# Patient Record
Sex: Female | Born: 1995 | Race: White | Hispanic: Yes | Marital: Single | Smoking: Never smoker
Health system: Southern US, Community
[De-identification: ages and names within clinical notes are randomized; demographics above are authoritative.]

## PROBLEM LIST (undated history)

## (undated) DIAGNOSIS — J45909 Unspecified asthma, uncomplicated: Secondary | ICD-10-CM

## (undated) DIAGNOSIS — L709 Acne, unspecified: Secondary | ICD-10-CM

## (undated) HISTORY — DX: Acne, unspecified: L70.9

## (undated) HISTORY — DX: Unspecified asthma, uncomplicated: J45.909

---
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
## 2017-05-01 ENCOUNTER — Encounter: Payer: Self-pay | Admitting: Family Medicine

## 2017-05-01 ENCOUNTER — Ambulatory Visit: Payer: BC Managed Care – PPO | Admitting: Family Medicine

## 2017-05-01 VITALS — BP 110/70 | Temp 98.8°F | Ht 62.0 in | Wt 150.0 lb

## 2017-05-01 DIAGNOSIS — R21 Rash and other nonspecific skin eruption: Secondary | ICD-10-CM | POA: Diagnosis not present

## 2017-05-01 MED ORDER — CLINDAMYCIN PHOSPHATE 1 % EX GEL: Freq: Two times a day (BID) | CUTANEOUS | 3 refills | Status: DC

## 2017-05-01 MED ORDER — MOMETASONE FUROATE 0.1 % EX CREA: TOPICAL_CREAM | CUTANEOUS | 0 refills | Status: DC

## 2017-05-01 MED ORDER — ALBUTEROL SULFATE HFA 108 (90 BASE) MCG/ACT IN AERS: 2.0000 | INHALATION_SPRAY | Freq: Four times a day (QID) | RESPIRATORY_TRACT | 2 refills | Status: DC | PRN

## 2017-05-01 NOTE — Progress Notes (Signed)
   Subjective:    Patient ID: Kaitlyn PontesChristina Munoz, female    DOB: 12/21/1995, 22 y.o.   MRN: [id number]  Rash  This is a new problem. Episode onset: 2 weeks. Treatments tried: tried in the past clindamycin ph and benzolyl perox and tretinoin cream.  has appt with derm on feb 28th.   Requesting refill on ventolin inhaler, uses prn only, not very often   At school [employer] majoring in [employer]   was using the tretinoin and clindamycin , with the  Acne flared up scaly and itchy near the areas of acne  Skin dryi and scaly   Also aroound the ch  Still persisted znd itchy after using both meds  Pt feels tretinoin .05 %  Caused it   Used als o clind benzoyl pe 1.2 ? 5                                Review of Systems  Skin: Positive for rash.       Objective:   Physical Exam Alert vitals stable, NAD. Blood pressure good on repeat. HEENT normal. Lungs clear. Heart regular rate and rhythm. Cheeks scattered inflamed comedones lateral forehead lateral cheeks perioral positive scaly eczematous-like eruption       Assessment & Plan:  Impression acne with eczema plan stop tretinoin.  Stop benzoyl peroxide.  Cleocin gel twice daily.  Add Elocon cream twice daily to eczema-like rash

## 2017-07-23 ENCOUNTER — Telehealth: Payer: Self-pay | Admitting: Family Medicine

## 2017-07-23 NOTE — Telephone Encounter (Signed)
Please let the patient know that I recently received a note from [facility] stating that the patient had elevated liver enzymes.-According to the note the patient is aware of it.  It is important for the patient to do a follow-up office visit to discuss this plus also do necessary testing to help try to figure out the cause of this.  It is possible we may have to refer the patient to gastroenterology after initial work-up if it does not reveal the reason.  Liver enzyme AST 32 ALT 72 normal bilirubin on July 17, 2017 with lab work from dermatology it is important for the patient's health that she understands she needs to schedule this office visit and do a follow-up regarding this.  Please document accordingly.

## 2017-07-24 NOTE — Telephone Encounter (Signed)
Patient contacted; pt scheduling a follow up visit

## 2017-08-02 ENCOUNTER — Ambulatory Visit: Payer: BC Managed Care – PPO | Admitting: Family Medicine

## 2017-08-02 VITALS — BP 118/80 | Ht 62.0 in | Wt 150.6 lb

## 2017-08-02 DIAGNOSIS — R740 Nonspecific elevation of levels of transaminase and lactic acid dehydrogenase [LDH]: Secondary | ICD-10-CM | POA: Diagnosis not present

## 2017-08-02 DIAGNOSIS — R7401 Elevation of levels of liver transaminase levels: Secondary | ICD-10-CM

## 2017-08-02 NOTE — Progress Notes (Signed)
   Subjective:    Patient ID: Kaitlyn Munoz, female    DOB: [birth year], 22 y.o.   MRN: [id number]  HPI Pt following up from dermatologist. Dr. Michell Heinrich at [facility]. Patient with elevated liver enzymes Please see telephone message recently that has a details She has a family history of liver cancer with her grandfather fatty liver with her mother patient denies drinking alcohol on a regular basis denies hepatitis denies health or sickness issues Recently started Accutane but states liver enzymes slightly elevated before that  Review of Systems  Constitutional: Negative for activity change, fatigue and fever.  HENT: Negative for congestion.   Respiratory: Negative for cough, chest tightness and shortness of breath.   Cardiovascular: Negative for chest pain and leg swelling.  Gastrointestinal: Negative for abdominal pain.  Skin: Negative for color change.  Neurological: Negative for headaches.  Psychiatric/Behavioral: Negative for behavioral problems.       Objective:   Physical Exam  Constitutional: She appears well-nourished. No distress.  Cardiovascular: Normal rate, regular rhythm and normal heart sounds.  No murmur heard. Pulmonary/Chest: Effort normal and breath sounds normal. No respiratory distress.  Musculoskeletal: She exhibits no edema.  Lymphadenopathy:    She has no cervical adenopathy.  Neurological: She is alert. She exhibits normal muscle tone.  Psychiatric: Her behavior is normal.  Vitals reviewed.  Abdomen soft no enlargement of the liver       Assessment & Plan:  Elevated liver enzyme Check lab work If still elevated will need further testing including ultrasound and extensive panel extensive panel if ongoing troubles will need GI referral  Patient understands

## 2017-08-03 LAB — HEPATIC FUNCTION PANEL
ALK PHOS: 84 IU/L (ref 39–117)
ALT: 140 IU/L — AB (ref 0–32)
AST: 69 IU/L — AB (ref 0–40)
Albumin: 4.3 g/dL (ref 3.5–5.5)
BILIRUBIN TOTAL: 0.2 mg/dL (ref 0.0–1.2)
Bilirubin, Direct: 0.09 mg/dL (ref 0.00–0.40)
Total Protein: 7 g/dL (ref 6.0–8.5)

## 2017-08-04 ENCOUNTER — Other Ambulatory Visit: Payer: Self-pay | Admitting: *Deleted

## 2017-08-04 DIAGNOSIS — R748 Abnormal levels of other serum enzymes: Secondary | ICD-10-CM

## 2017-08-07 ENCOUNTER — Telehealth: Payer: Self-pay

## 2017-08-07 NOTE — Telephone Encounter (Signed)
Patient is scheduled for U/s abd and needs to be e signed please  

## 2017-08-10 ENCOUNTER — Encounter: Payer: Self-pay | Admitting: Gastroenterology

## 2017-08-10 ENCOUNTER — Encounter (INDEPENDENT_AMBULATORY_CARE_PROVIDER_SITE_OTHER): Payer: Self-pay

## 2017-08-10 ENCOUNTER — Encounter: Payer: Self-pay | Admitting: Family Medicine

## 2017-08-11 ENCOUNTER — Ambulatory Visit (HOSPITAL_COMMUNITY)
Admission: RE | Admit: 2017-08-11 | Discharge: 2017-08-11 | Disposition: A | Discharge status: Home / Self Care | Payer: BC Managed Care – PPO | Source: Ambulatory Visit | Attending: Family Medicine | Admitting: Family Medicine

## 2017-08-11 DIAGNOSIS — R748 Abnormal levels of other serum enzymes: Secondary | ICD-10-CM | POA: Diagnosis not present

## 2017-08-12 LAB — FERRITIN: FERRITIN: 84 ng/mL (ref 15–150)

## 2017-08-12 LAB — HEPATITIS C ANTIBODY: Hep C Virus Ab: <0.1 s/co ratio (ref 0.0–0.9)

## 2017-08-12 LAB — ANA: ANA: POSITIVE — AB

## 2017-08-12 LAB — HEPATITIS B SURFACE ANTIGEN: Hepatitis B Surface Ag: NEGATIVE

## 2017-08-12 LAB — CERULOPLASMIN: CERULOPLASMIN: 46.3 mg/dL — AB (ref 19.0–39.0)

## 2017-10-06 ENCOUNTER — Other Ambulatory Visit (INDEPENDENT_AMBULATORY_CARE_PROVIDER_SITE_OTHER): Payer: BC Managed Care – PPO

## 2017-10-06 ENCOUNTER — Encounter: Payer: Self-pay | Admitting: Gastroenterology

## 2017-10-06 ENCOUNTER — Ambulatory Visit: Payer: BC Managed Care – PPO | Admitting: Gastroenterology

## 2017-10-06 VITALS — BP 94/72 | HR 72 | Ht 62.0 in | Wt 147.0 lb

## 2017-10-06 DIAGNOSIS — K602 Anal fissure, unspecified: Secondary | ICD-10-CM

## 2017-10-06 DIAGNOSIS — R7989 Other specified abnormal findings of blood chemistry: Secondary | ICD-10-CM

## 2017-10-06 DIAGNOSIS — R945 Abnormal results of liver function studies: Secondary | ICD-10-CM | POA: Diagnosis not present

## 2017-10-06 LAB — HEPATIC FUNCTION PANEL
ALBUMIN: 4.4 g/dL (ref 3.5–5.2)
ALK PHOS: 87 U/L (ref 39–117)
ALT: 39 U/L — ABNORMAL HIGH (ref 0–35)
AST: 23 U/L (ref 0–37)
Bilirubin, Direct: 0.1 mg/dL (ref 0.0–0.3)
Total Bilirubin: 0.4 mg/dL (ref 0.2–1.2)
Total Protein: 7.9 g/dL (ref 6.0–8.3)

## 2017-10-06 MED ORDER — POLYETHYLENE GLYCOL 3350 17 G PO PACK: 17.0000 g | PACK | Freq: Every day | ORAL | 0 refills | Status: DC

## 2017-10-06 MED ORDER — AMBULATORY NON FORMULARY MEDICATION: 0 refills | Status: DC

## 2017-10-06 NOTE — Progress Notes (Signed)
HPI :  22 year old female with history of acne and asthma, referred to our office by Babs Sciara, MD, for elevated liver enzymes.  The patient was noted to have the following labs over the past few months as outlined below from review of her chart:  06/16/17 - ALT 63, AST 23 07/17/17 - ALT 72, AST 32 08/02/17 - ALT 140, AST 69, AP 84, T bil 0.2, T prot 7.0 ANA (+) Ceruloplasmin, ferritin, hep B surface antigen, hep C AB negative  [place] 08/11/17 - normal  She denies any known history of liver problems that she is aware of. She never been jaundiced. No abdominal pains, no problems with edema. Her mother has a history of lymphoma. No known family history of chronic liver disease. She has been taking Accutane for the treatment of acne since April 1. Her initial ALT level that was abnormal was at the end of March. Most recently in May ALT peaked at 140. She continues to take Accutane and is planned to take this for another 2 months or so. She denies any other new medications that she has been taking. She denies any herbal supplements, she denies any over the counter products otherwise. She had an ultrasound of the liver on May 24 which was normal. She denies any history of autoimmune diseases. She has a positive ANA on lab work. She denies tobacco use. She drinks rare alcohol socially  Otherwise has noted a few episodes of blood on the toilet paper with wiping. This is been associated with pain in her rectum with bowel movements. She does endorse some hard stools and straining more recently since she has been on the Accutane. She thinks is related to her symptoms. No abdominal pains   Past Medical History:  Diagnosis Date  . Acne   . Asthma      No past surgical history on file. No family history on file. Social History   Tobacco Use  . Smoking status: Never Smoker  Substance Use Topics  . Alcohol use: Not on file  . Drug use: Not on file   Current Outpatient Medications  Medication  Sig Dispense Refill  . albuterol (PROVENTIL HFA;VENTOLIN HFA) 108 (90 Base) MCG/ACT inhaler Inhale 2 puffs into the lungs every 6 (six) hours as needed for wheezing or shortness of breath.    . drospirenone-ethinyl estradiol (YASMIN,ZARAH,SYEDA) 3-0.03 MG tablet Take 1 tablet by mouth daily.    . ISOtretinoin (ACCUTANE) 40 MG capsule Take 40 mg by mouth 2 (two) times daily.    . AMBULATORY NON FORMULARY MEDICATION Medication Name: Nitroglycerine ointment 0.125 %  Apply a pea sized amount internally three times daily. Dispense 30 GM zero refill 30 g 0  . polyethylene glycol (MIRALAX) packet Take 17 g by mouth daily. 14 each 0   No current facility-administered medications for this visit.    No Known Allergies   Review of Systems: All systems reviewed and negative except where noted in HPI.    No results found.  Physical Exam: BP 94/72   Pulse 72   Ht 5\' 2"  (1.575 m)   Wt 147 lb (66.7 kg)   BMI 26.89 kg/m  Constitutional: Pleasant,well-developed, female in no acute distress. HEENT: Normocephalic and atraumatic. Conjunctivae are normal. No scleral icterus. Neck supple.  Cardiovascular: Normal rate, regular rhythm.  Pulmonary/chest: Effort normal and breath sounds normal. No wheezing, rales or rhonchi. Abdominal: Soft, nondistended, nontender. There are no masses palpable. No hepatomegaly. DRE - standby Lucius Conn CMA,  small posterior midline anal fissure, no mass lesions otherwise Extremities: no edema Lymphadenopathy: No cervical adenopathy noted. Neurological: Alert and oriented to person place and time. Skin: Skin is warm and dry. No rashes noted. Psychiatric: Normal mood and affect. Behavior is normal.   ASSESSMENT AND PLAN: 22 year old female with a history as outlined above, here for new patient assessment of the following issues:  Abnormal liver function testing - elevated ALT and AST. Noted to be present prior to initiating Accutane, which has since risen since  starting Accutane. Workup thus far has showed a positive ANA, while imaging negative. I discussed ddx with the patient and her mother who was present today. Liver test abnormalities occur in up to 15% of patients on Accutane, although marked elevations above three times the upper limit of normal or requiring drug discontinuation are rare (<1%). Will send additional labs today, we'll repeat LFTs to see where this is trending and get a copy of the labs from July 1. I will check an IgG level and smooth muscle antibody as well as antimitochondrial antibody to screen for autoimmune liver diseases. I will complete her workup for hemochromatosis with TIBC, and also send alpha-1 antitrypsin level to ensure no evidence of chronic liver disease made worse by Accutane. If the liver enzymes are continuing to rise would recommend that she hold the Accutane. If it has peaked and downtrended, she may continue with it but will need close monitoring. We discussed that if her ALT remains elevated without other clear cause and we stop the Accutane, will see if this resolves. In that setting if her ALT elevation persists, we may need to consider liver biopsy. Hopefully that is not necessary. She and mother agreed with the plan. Will let her know the results of her labs.   Anal fissure - noted on perianal exam, in the setting of constipation, which is the likely cause of her symptoms. Recommend topical nitroglycerin 0.125%, apply pea sized amount up to TID PRN. Also recommend Miralax daily. If symptoms persist despite this regimen she should contact us for reassessment.   Ileene PatrickSteven Shenay Torti, MD [facility]  CC: Babs SciaraLuking, Scott A, MD

## 2017-10-06 NOTE — Patient Instructions (Addendum)
If you are age 21 or older, your body mass index should be between 23-30. Your Body mass index is 26.89 kg/m. If this is out of the aforementioned range listed, please consider follow up with your Primary Care Provider.  If you are age 21 or younger, your body mass index should be between 19-25. Your Body mass index is 26.89 kg/m. If this is out of the aformentioned range listed, please consider follow up with your Primary Care Provider.   Please go to the lab in the basement of our building to have lab work done as you leave today.  We have sent a prescription for nitroglycerin 0.125% gel to [facility]. You should apply a pea size amount to your rectum three times daily.  [facility]'s information is below: Address: [address], [place], [place] [address]  [phone]  *Please DO NOT go directly from our office to pick up this medication! Give the pharmacy 1 day to process the prescription as this is compounded at takes time to make.  Please purchase the following medications over the counter and take as directed: Miralax - take as directed daily.   Thank you for entrusting me with your care and for choosing [facility], Dr. Ileene PatrickSteven Armbruster

## 2017-10-07 LAB — IRON AND TIBC
Iron Saturation: 25 % (ref 15–55)
Iron: 92 ug/dL (ref 27–159)
TIBC: 367 ug/dL (ref 250–450)
UIBC: 275 ug/dL (ref 131–425)

## 2017-10-10 ENCOUNTER — Telehealth: Payer: Self-pay | Admitting: Gastroenterology

## 2017-10-10 ENCOUNTER — Other Ambulatory Visit: Payer: Self-pay

## 2017-10-10 MED ORDER — AMBULATORY NON FORMULARY MEDICATION: 0 refills | Status: DC

## 2017-10-10 NOTE — Progress Notes (Signed)
[place] lives in [place]. Sending nitroglycerine ointment to [facility] in [phone]

## 2017-10-10 NOTE — Telephone Encounter (Signed)
Pt called to inform that nitroglycerine was sent to wrong pharmacy. [facility] [place]. Apparently [facility].

## 2017-10-10 NOTE — Telephone Encounter (Signed)
Called and let pt know I sent the rx to [facility] in [place].

## 2017-10-11 LAB — ALPHA-1-ANTITRYPSIN: A-1 Antitrypsin, Ser: 185 mg/dL (ref 83–199)

## 2017-10-11 LAB — MITOCHONDRIAL ANTIBODIES: Mitochondrial M2 Ab, IgG: <=20 U

## 2017-10-11 LAB — ANTI-SMOOTH MUSCLE ANTIBODY, IGG: Actin (Smooth Muscle) Antibody (IGG): <20 U (ref <20)

## 2017-10-11 LAB — IGG: IgG (Immunoglobin G), Serum: 1161 mg/dL (ref 600–1640)

## 2017-10-12 ENCOUNTER — Other Ambulatory Visit: Payer: Self-pay

## 2017-10-12 DIAGNOSIS — R7989 Other specified abnormal findings of blood chemistry: Secondary | ICD-10-CM

## 2017-10-12 DIAGNOSIS — R945 Abnormal results of liver function studies: Secondary | ICD-10-CM

## 2017-10-12 NOTE — Progress Notes (Signed)
Repeat LFTs in August. See labs from 10-12-17

## 2017-11-10 ENCOUNTER — Telehealth: Payer: Self-pay

## 2017-11-10 NOTE — Telephone Encounter (Signed)
-----   Message from Cooper RenderJanet M Anatalia Kronk, CMA sent at 10/12/2017  1:08 PM EDT ----- Regarding: LFTS due in august Pt due for LFTs around 11-12-17. Orders are in.

## 2017-11-10 NOTE — Telephone Encounter (Signed)
Called and spoke to pt.  Instructed her to go to the lab. She expressed understanding. Lab entered.

## 2018-01-05 ENCOUNTER — Encounter: Payer: Self-pay | Admitting: Gynecology

## 2018-01-05 ENCOUNTER — Ambulatory Visit: Payer: BC Managed Care – PPO | Admitting: Gynecology

## 2018-01-05 VITALS — BP 118/76 | Ht 63.0 in | Wt 149.0 lb

## 2018-01-05 DIAGNOSIS — Z01419 Encounter for gynecological examination (general) (routine) without abnormal findings: Secondary | ICD-10-CM | POA: Diagnosis not present

## 2018-01-05 DIAGNOSIS — Z113 Encounter for screening for infections with a predominantly sexual mode of transmission: Secondary | ICD-10-CM

## 2018-01-05 MED ORDER — DROSPIRENONE-ETHINYL ESTRADIOL 3-0.03 MG PO TABS: 1.0000 | ORAL_TABLET | Freq: Every day | ORAL | 4 refills | Status: DC

## 2018-01-05 NOTE — Addendum Note (Signed)
Addended by: Dayna Barker on: 01/05/2018 11:28 AM   Modules accepted: Orders

## 2018-01-05 NOTE — Patient Instructions (Addendum)
Check with your mother to see if you receive the Gardasil vaccine from your pediatrician.  If not then consider starting the series which is 3 injections through [facility].  Follow-up in 1 year for annual exam, sooner as needed.

## 2018-01-05 NOTE — Progress Notes (Signed)
    Kaitlyn Munoz [birth year] [id number]        22 y.o.  G0P0000 new patient for first gynecologic exam.  Having regular monthly menses.  Currently on Yasmin equivalent BCPs started as required for Accutane.  Is doing well with these.  Currently is sexually active.  Past medical history,surgical history, problem list, medications, allergies, family history and social history were all reviewed and documented as reviewed in the EPIC chart.  ROS:  Performed with pertinent positives and negatives included in the history, assessment and plan.   Additional significant findings : None   Exam: Kennon Portela assistant Vitals:   01/05/18 1049  BP: 118/76  Weight: 149 lb (67.6 kg)  Height: 5\' 3"  (1.6 m)   Body mass index is 26.39 kg/m.  General appearance:  Normal affect, orientation and appearance. Skin: Grossly normal HEENT: Without gross lesions.  No cervical or supraclavicular adenopathy. Thyroid normal.  Lungs:  Clear without wheezing, rales or rhonchi Cardiac: RR, without RMG Abdominal:  Soft, nontender, without masses, guarding, rebound, organomegaly or hernia Breasts:  Examined lying and sitting without masses, retractions, discharge or axillary adenopathy. Pelvic:  Ext, BUS, Vagina: Normal  Cervix: Normal.  GC/Chlamydia and Pap smear done  Uterus: Anteverted, normal size, shape and contour, midline and mobile nontender   Adnexa: Without masses or tenderness    Anus and perineum: Normal     Assessment/Plan:  22 y.o. G0P0000 female for annual gynecologic exam with regular menses on Yasmin BCPs..   1. Yasmin BCPs.  Doing well with these.  Refill x1 year provided. 2. First Pap smear done today as she will be turning 22 next month. 3. GC/Chlamydia screen done.  Continue condoms to help decrease STD risks. 4. Gardasil series unclear.  Patient is going to check with her mother to see if she received the series.  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 5. Breast health.  Breast exam normal today. 6. Health maintenance.  No routine lab work done as patient has primary physician and currently without signs or symptoms to warrant blood work.  Follow-up in 1 year, sooner as needed.   Dara Lords MD, 11:10 AM 01/05/2018

## 2018-01-06 LAB — C. TRACHOMATIS/N. GONORRHOEAE RNA
C. trachomatis RNA, TMA: NOT DETECTED
N. GONORRHOEAE RNA, TMA: NOT DETECTED

## 2018-01-08 LAB — PAP IG W/ RFLX HPV ASCU

## 2018-10-18 ENCOUNTER — Ambulatory Visit
Admission: EM | Admit: 2018-10-18 | Discharge: 2018-10-18 | Disposition: A | Discharge status: Home / Self Care | Payer: BC Managed Care – PPO | Attending: Emergency Medicine | Admitting: Emergency Medicine

## 2018-10-18 ENCOUNTER — Other Ambulatory Visit: Payer: Self-pay

## 2018-10-18 DIAGNOSIS — R112 Nausea with vomiting, unspecified: Secondary | ICD-10-CM | POA: Insufficient documentation

## 2018-10-18 DIAGNOSIS — Z3202 Encounter for pregnancy test, result negative: Secondary | ICD-10-CM | POA: Diagnosis not present

## 2018-10-18 DIAGNOSIS — N3001 Acute cystitis with hematuria: Secondary | ICD-10-CM | POA: Diagnosis not present

## 2018-10-18 DIAGNOSIS — R300 Dysuria: Secondary | ICD-10-CM | POA: Diagnosis not present

## 2018-10-18 LAB — POCT URINALYSIS DIP (MANUAL ENTRY)
Glucose, UA: NEGATIVE mg/dL
Nitrite, UA: POSITIVE — AB
Protein Ur, POC: 30 mg/dL — AB
Spec Grav, UA: 1.02 (ref 1.010–1.025)
Urobilinogen, UA: 0.2 E.U./dL
pH, UA: 5.5 (ref 5.0–8.0)

## 2018-10-18 LAB — POCT URINE PREGNANCY: Preg Test, Ur: NEGATIVE

## 2018-10-18 MED ORDER — NITROFURANTOIN MONOHYD MACRO 100 MG PO CAPS: 100.0000 mg | ORAL_CAPSULE | Freq: Two times a day (BID) | ORAL | 0 refills | Status: AC

## 2018-10-18 MED ORDER — ONDANSETRON HCL 4 MG PO TABS: 4.0000 mg | ORAL_TABLET | Freq: Four times a day (QID) | ORAL | 0 refills | Status: DC

## 2018-10-18 MED ORDER — PHENAZOPYRIDINE HCL 200 MG PO TABS: 200.0000 mg | ORAL_TABLET | Freq: Three times a day (TID) | ORAL | 0 refills | Status: DC

## 2018-10-18 NOTE — Discharge Instructions (Signed)
Urine did show signs of infection °Urine culture sent.  We will call you with abnormal results.   °Push fluids and get plenty of rest.   °Take antibiotic as directed and to completion °Take pyridium as prescribed and as needed for symptomatic relief °Follow up with PCP if symptoms persists °Return here or go to ER if you have any new or worsening symptoms such as fever, worsening abdominal pain, nausea/vomiting, flank pain, etc... °

## 2018-10-18 NOTE — ED Provider Notes (Signed)
[facility]   CC: Burning with urination  SUBJECTIVE:  Kaitlyn PontesChristina Munoz is a 23 y.o. female who complains of abrupt onset of urinary frequency, urgency and dysuria for that began last night.  Symptoms began after taking a bubble bath last night.  Denies abdominal or flank pain, just admits to associated lower abdominal pressure.  Has tried AZO with minimal relief.  Symptoms are made worse with urination.  Admits to similar symptoms in the past and diagnosed with UTI. Also complains of associated hematuria, nausea and vomiting.  Patient reports vomiting and dry heaving approximately 25 times total, over night, now resolved.  Drank water prior to coming in and was able to keep down. Denies fever, chills, abdominal pain, flank pain, vaginal pain, abnormal vaginal discharge, vaginal itching, concern for STD, concern for pregnancy.    LMP: Patient's last menstrual period was 09/27/2018. Takes BC, does not miss pills  ROS: As in HPI.  All other pertinent ROS negative.     Past Medical History:  Diagnosis Date  . Acne   . Asthma    History reviewed. No pertinent surgical history. No Known Allergies No current facility-administered medications on file prior to encounter.    Current Outpatient Medications on File Prior to Encounter  Medication Sig Dispense Refill  . albuterol (PROVENTIL HFA;VENTOLIN HFA) 108 (90 Base) MCG/ACT inhaler Inhale 2 puffs into the lungs every 6 (six) hours as needed for wheezing or shortness of breath.    . drospirenone-ethinyl estradiol (YASMIN,ZARAH,SYEDA) 3-0.03 MG tablet Take 1 tablet by mouth daily. 3 Package 4   Social History   Socioeconomic History  . Marital status: Single    Spouse name: Not on file  . Number of children: Not on file  . Years of education: Not on file  . Highest education level: Not on file  Occupational History  . Not on file  Social Needs  . Financial resource strain: Not on file  . Food insecurity    Worry: Not on  file    Inability: Not on file  . Transportation needs    Medical: Not on file    Non-medical: Not on file  Tobacco Use  . Smoking status: Never Smoker  . Smokeless tobacco: Never Used  Substance and Sexual Activity  . Alcohol use: Yes    Comment: rare  . Drug use: Never  . Sexual activity: Yes    Birth control/protection: Pill, Condom    Comment: 1st intercourse 89 yo-1 partner  Lifestyle  . Physical activity    Days per week: Not on file    Minutes per session: Not on file  . Stress: Not on file  Relationships  . Social Musicianconnections    Talks on phone: Not on file    Gets together: Not on file    Attends religious service: Not on file    Active member of club or organization: Not on file    Attends meetings of clubs or organizations: Not on file    Relationship status: Not on file  . Intimate partner violence    Fear of current or ex partner: Not on file    Emotionally abused: Not on file    Physically abused: Not on file    Forced sexual activity: Not on file  Other Topics Concern  . Not on file  Social History Narrative  . Not on file   Family History  Problem Relation Age of Onset  . Cancer Mother  Lymphoma  . Diabetes Father   . Diabetes Maternal Grandmother     OBJECTIVE:  Vitals:   10/18/18 1127  BP: 99/64  Pulse: 65  Resp: 16  Temp: 98.4 F (36.9 C)  SpO2: 98%   General appearance: Alert; well-appearing, nontoxic HEENT: NCAT.  PERRL, EOMI grossly; Oropharynx clear.  Lungs: clear to auscultation bilaterally without adventitious breath sounds Heart: regular rate and rhythm.   Abdomen: soft; non-distended; no tenderness; bowel sounds present; no guarding Back: no CVA tenderness Extremities: no edema; symmetrical with no gross deformities Skin: warm and dry Neurologic: Ambulates from chair to exam table without difficulty Psychological: alert and cooperative; normal mood and affect  Labs Reviewed  POCT URINALYSIS DIP (MANUAL ENTRY) -  Abnormal; Notable for the following components:      Result Value   Color, UA orange (*)    Clarity, UA cloudy (*)    Bilirubin, UA small (*)    Ketones, POC UA >= (160) (*)    Blood, UA moderate (*)    Protein Ur, POC =30 (*)    Nitrite, UA Positive (*)    Leukocytes, UA Trace (*)    All other components within normal limits  URINE CULTURE  POCT URINE PREGNANCY    ASSESSMENT & PLAN:  1. Acute cystitis with hematuria   2. Dysuria   3. Non-intractable vomiting with nausea, unspecified vomiting type     Meds ordered this encounter  Medications  . nitrofurantoin, macrocrystal-monohydrate, (MACROBID) 100 MG capsule    Sig: Take 1 capsule (100 mg total) by mouth 2 (two) times daily for 5 days.    Dispense:  10 capsule    Refill:  0    Order Specific Question:   Supervising Provider    Answer:   Raylene Everts [[id number]]  . phenazopyridine (PYRIDIUM) 200 MG tablet    Sig: Take 1 tablet (200 mg total) by mouth 3 (three) times daily.    Dispense:  6 tablet    Refill:  0    Order Specific Question:   Supervising Provider    Answer:   Raylene Everts [[id number]]  . ondansetron (ZOFRAN) 4 MG tablet    Sig: Take 1 tablet (4 mg total) by mouth every 6 (six) hours.    Dispense:  12 tablet    Refill:  0    Order Specific Question:   Supervising Provider    Answer:   Raylene Everts [[id number]]   Urine did show signs of infection Urine culture sent.  We will call you with abnormal results.   Push fluids and get plenty of rest.   Take antibiotic as directed and to completion Take pyridium as prescribed and as needed for symptomatic relief Follow up with PCP if symptoms persists Return here or go to ER if you have any new or worsening symptoms such as fever, worsening abdominal pain, nausea/vomiting, flank pain, etc...  Zofran prescribed to use as needed for nausea.  Instructed to go to the ED if symptoms do not improve or worsened over the next day.    Outlined signs and  symptoms indicating need for more acute intervention. Patient verbalized understanding. After Visit Summary given.     Lestine Box, PA-C 10/18/18 1205

## 2018-10-18 NOTE — ED Triage Notes (Signed)
Pt began having uti symptoms last night, pt also states she has vomited several times during the night

## 2018-10-19 LAB — URINE CULTURE: Culture: NO GROWTH

## 2018-11-30 IMAGING — US US ABDOMEN LIMITED
1 series · 14 of 25 positions shown · non-contrast
Comparison: None.

CLINICAL DATA: Elevated LFTs

EXAM:
ULTRASOUND ABDOMEN LIMITED RIGHT UPPER QUADRANT

[Series 1: us abdomen limited · 0.18mm/px · 14 of 60 slices shown]
[im 1/60]
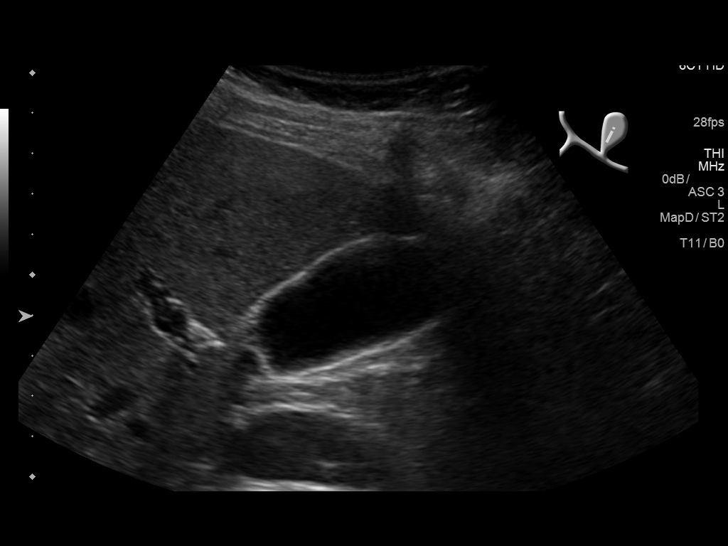
[im 5/60]
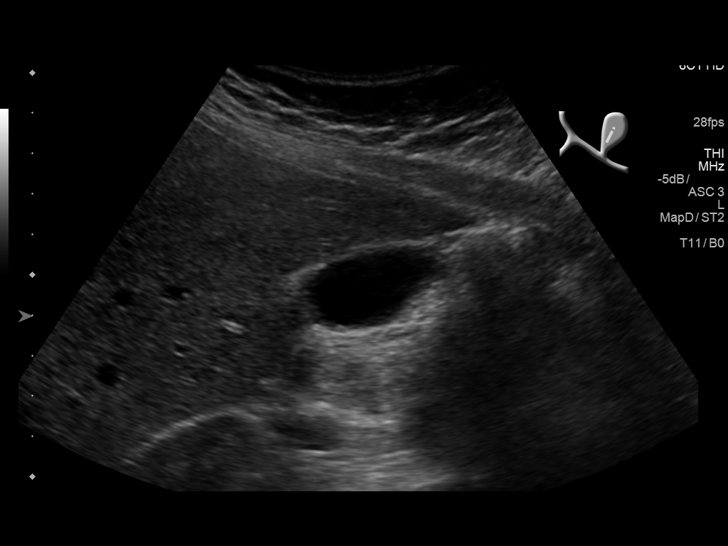
[im 10/60]
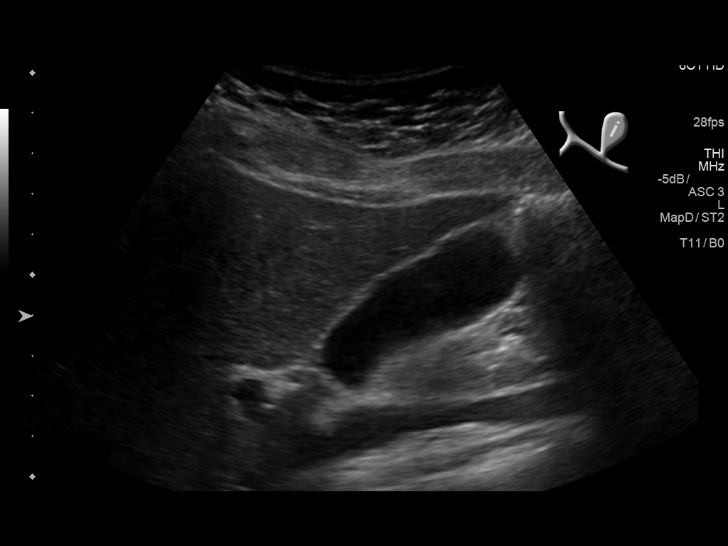
[im 15/60]
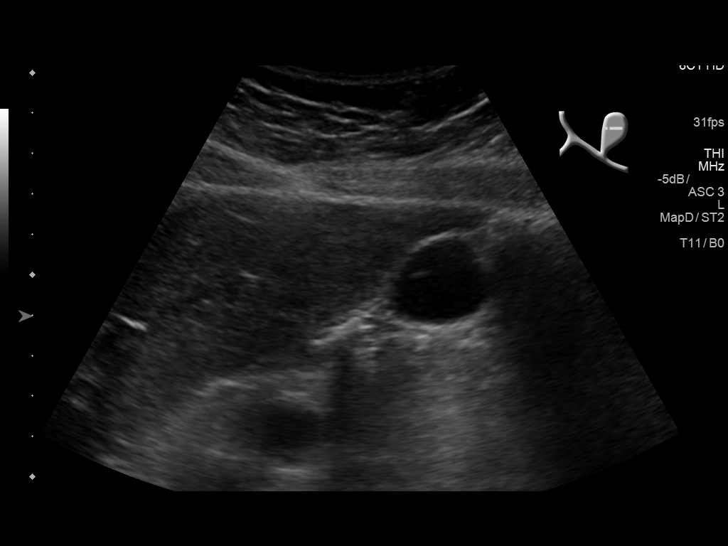
[im 20/60]
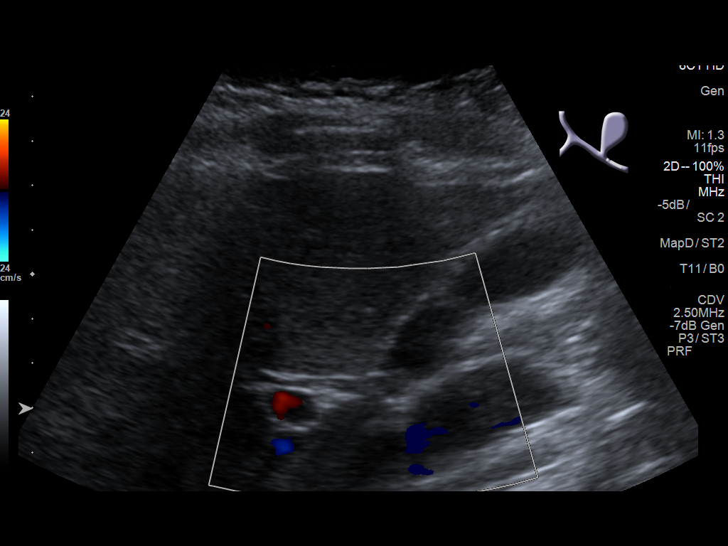
[im 23/60]
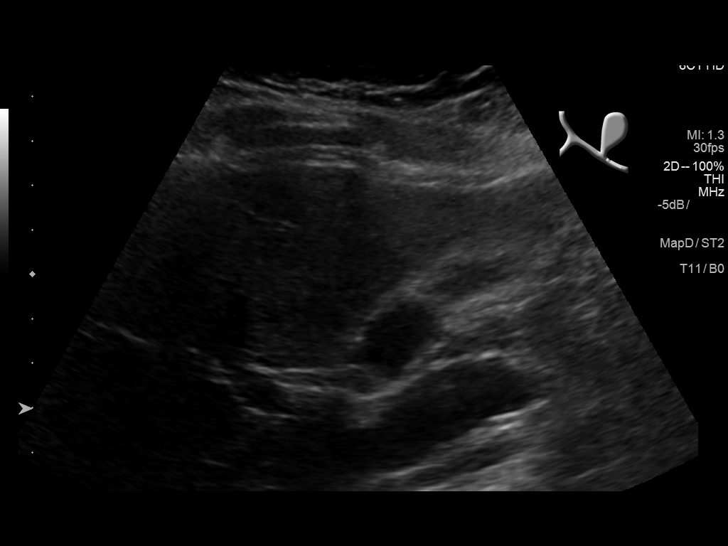
[im 28/60]
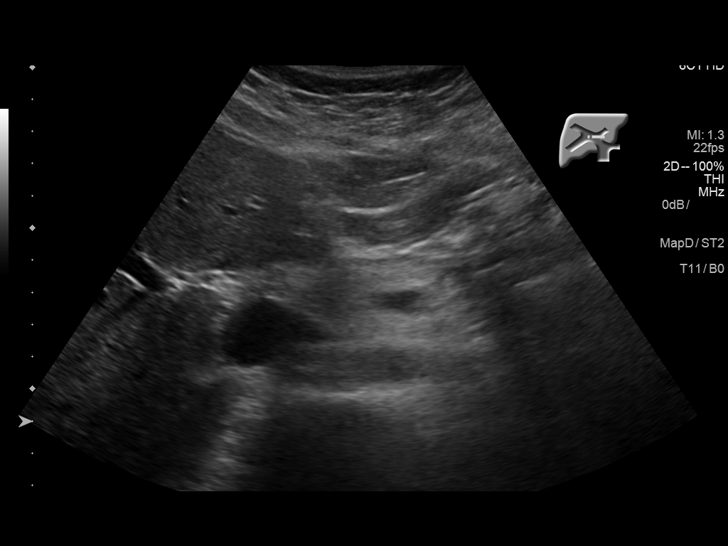
[im 32/60]
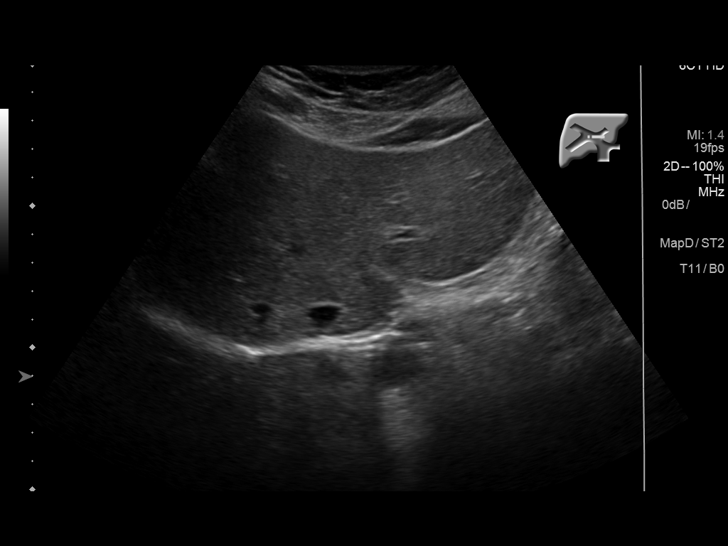
[im 37/60]
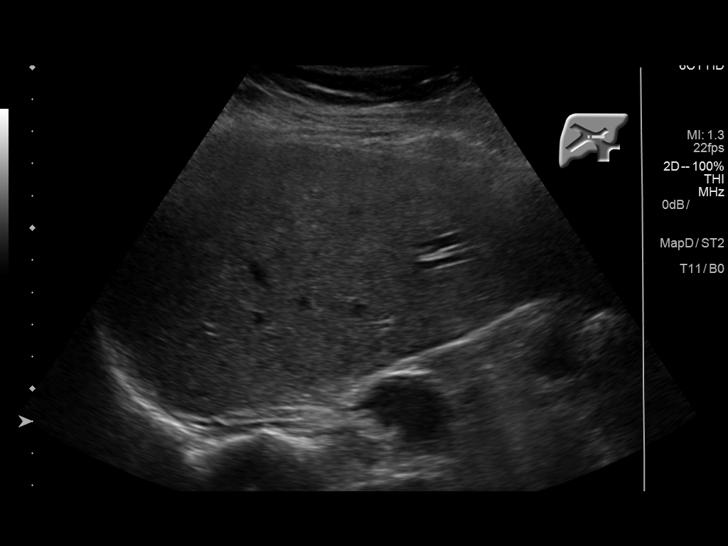
[im 40/60]
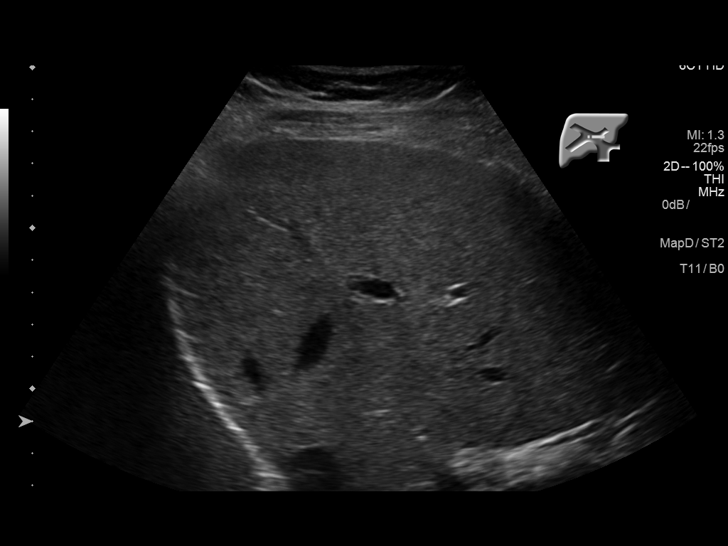
[im 45/60]
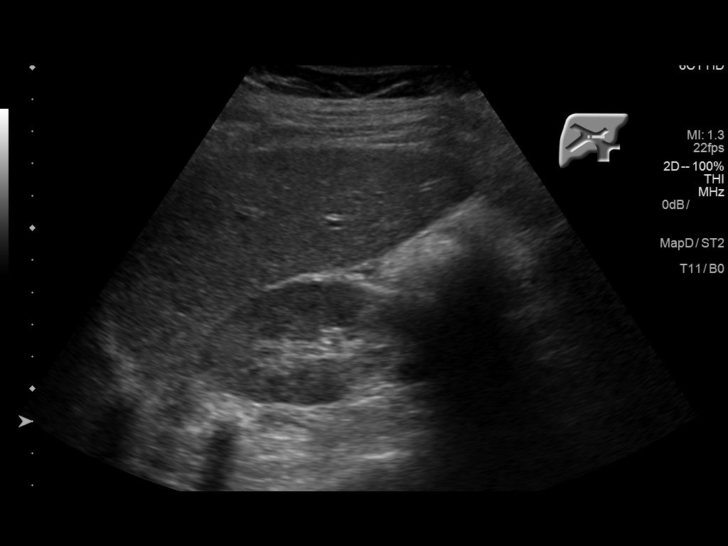
[im 50/60]
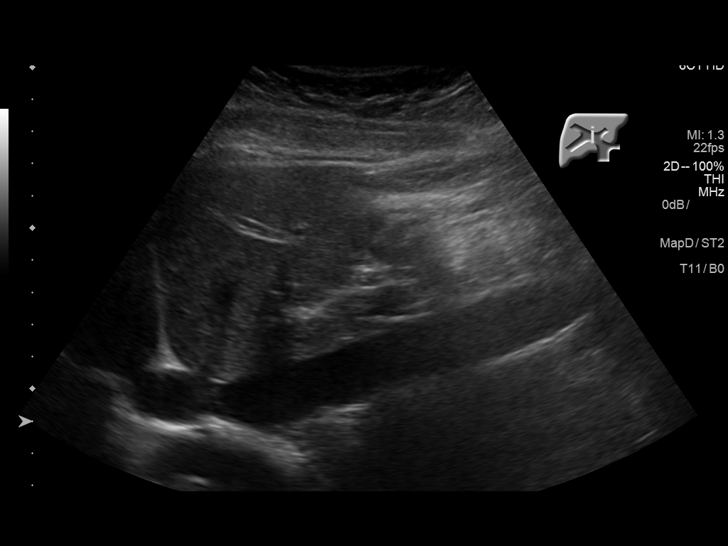
[im 55/60]
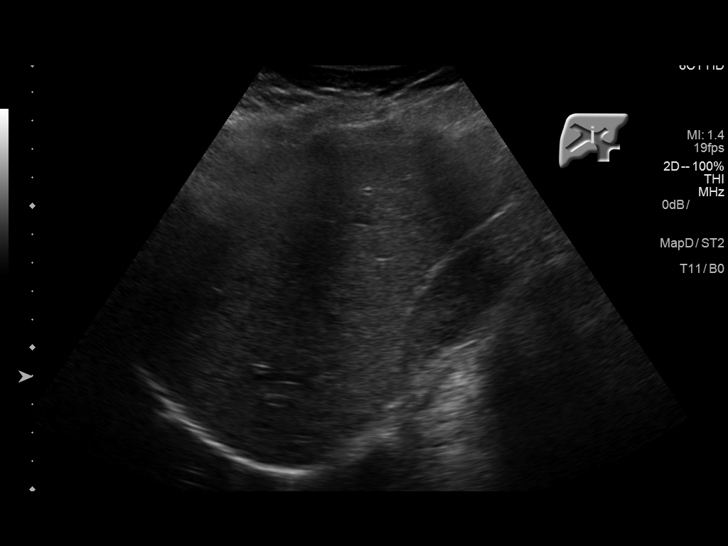
[im 60/60]
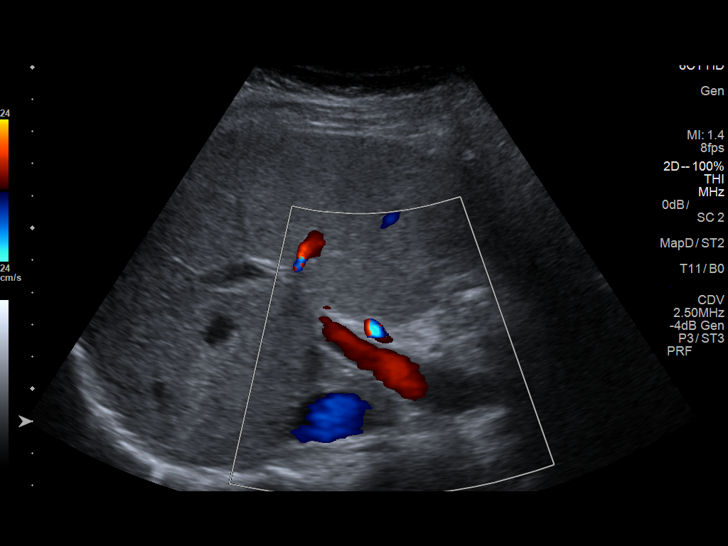

[14 of 25 positions shown; findings below may reference images not displayed]

FINDINGS: Gallbladder:

No gallstones or wall thickening visualized. No sonographic Murphy
sign noted by sonographer.

Common bile duct:

Diameter: Normal caliber, 3 mm

Liver:

No focal lesion identified. Within normal limits in parenchymal
echogenicity. Portal vein is patent on color Doppler imaging with
normal direction of blood flow towards the liver.
IMPRESSION: Unremarkable right upper quadrant ultrasound.

## 2018-12-10 ENCOUNTER — Encounter: Payer: Self-pay | Admitting: Gynecology

## 2019-01-10 ENCOUNTER — Other Ambulatory Visit: Payer: Self-pay

## 2019-01-11 ENCOUNTER — Encounter: Payer: Self-pay | Admitting: Gynecology

## 2019-01-11 ENCOUNTER — Ambulatory Visit: Payer: BC Managed Care – PPO | Admitting: Gynecology

## 2019-01-11 VITALS — BP 118/74 | Ht 62.0 in | Wt 134.0 lb

## 2019-01-11 DIAGNOSIS — Z01419 Encounter for gynecological examination (general) (routine) without abnormal findings: Secondary | ICD-10-CM

## 2019-01-11 DIAGNOSIS — Z113 Encounter for screening for infections with a predominantly sexual mode of transmission: Secondary | ICD-10-CM

## 2019-01-11 MED ORDER — DROSPIRENONE-ETHINYL ESTRADIOL 3-0.03 MG PO TABS: 1.0000 | ORAL_TABLET | Freq: Every day | ORAL | 4 refills | Status: DC

## 2019-01-11 NOTE — Patient Instructions (Signed)
Check to see if you have received the Gardasil vaccine in the past.  If not then I would recommend you complete the series.  Follow-up in 1 year for annual exam

## 2019-01-11 NOTE — Progress Notes (Signed)
    Decarla Siemen Aug 22, 1995 [id number]        23 y.o.  G0P0000 for annual gynecologic exam.  Without gynecologic complaints  Past medical history,surgical history, problem list, medications, allergies, family history and social history were all reviewed and documented as reviewed in the EPIC chart.  ROS:  Performed with pertinent positives and negatives included in the history, assessment and plan.   Additional significant findings : None   Exam: Caryn Bee assistant Vitals:   01/11/19 0944  BP: 118/74  Weight: 134 lb (60.8 kg)  Height: 5\' 2"  (1.575 m)   Body mass index is 24.51 kg/m.  General appearance:  Normal affect, orientation and appearance. Skin: Grossly normal HEENT: Without gross lesions.  No cervical or supraclavicular adenopathy. Thyroid normal.  Lungs:  Clear without wheezing, rales or rhonchi Cardiac: RR, without RMG Abdominal:  Soft, nontender, without masses, guarding, rebound, organomegaly or hernia Breasts:  Examined lying and sitting without masses, retractions, discharge or axillary adenopathy. Pelvic:  Ext, BUS, Vagina: Normal  Cervix: Normal.  GC/chlamydia  Uterus: Anteverted, normal size, shape and contour, midline and mobile nontender   Adnexa: Without masses or tenderness    Anus and perineum: Normal    Assessment/Plan:  23 y.o. G0P0000 female for annual gynecologic exam.  With regular menses, oral contraceptives  1. He has been BCPs.  Doing well wants to continue.  Refill x1 year provided. 2. Pap smear 2019.  No Pap smear done today.  Plan repeat Pap smear at 3-year interval per current screening guidelines. 3. STD screening.  GC/chlamydia screen done. 4. Gardasil series unclear.  I asked her last year to check to see if she received this and she did not.  I again asked her to check with her pediatrician/mother to see if she received the series.  If not I recommended she initiate the series. 5. Breast health.  Breast exam normal today.  SBE  monthly reviewed. 6. Health maintenance.  No routine lab work done.  She is without signs or symptoms to warrant screening at this time.  Follow-up 1 year, sooner as needed.   Anastasio Auerbach MD, 10:04 AM 01/11/2019

## 2019-01-12 LAB — C. TRACHOMATIS/N. GONORRHOEAE RNA
C. trachomatis RNA, TMA: NOT DETECTED
N. gonorrhoeae RNA, TMA: NOT DETECTED

## 2020-01-13 ENCOUNTER — Encounter: Payer: BC Managed Care – PPO | Admitting: Gynecology

## 2020-01-13 ENCOUNTER — Encounter: Payer: BC Managed Care – PPO | Admitting: Obstetrics and Gynecology

## 2020-01-13 DIAGNOSIS — Z0289 Encounter for other administrative examinations: Secondary | ICD-10-CM

## 2020-01-24 ENCOUNTER — Encounter: Payer: Self-pay | Admitting: Family Medicine

## 2020-01-29 ENCOUNTER — Encounter: Payer: Self-pay | Admitting: Family Medicine

## 2020-01-29 ENCOUNTER — Ambulatory Visit: Payer: BC Managed Care – PPO | Admitting: Family Medicine

## 2020-01-29 ENCOUNTER — Other Ambulatory Visit: Payer: Self-pay

## 2020-01-29 VITALS — BP 112/74 | HR 87 | Temp 97.8°F | Wt 146.0 lb

## 2020-01-29 DIAGNOSIS — J4520 Mild intermittent asthma, uncomplicated: Secondary | ICD-10-CM | POA: Diagnosis not present

## 2020-01-29 MED ORDER — ALBUTEROL SULFATE HFA 108 (90 BASE) MCG/ACT IN AERS: 2.0000 | INHALATION_SPRAY | Freq: Four times a day (QID) | RESPIRATORY_TRACT | 1 refills | Status: DC | PRN

## 2020-01-29 NOTE — Progress Notes (Signed)
Patient ID: Kaitlyn Munoz, female    DOB: 14-Dec-1995, 24 y.o.   MRN: [id number]   Chief Complaint  Patient presents with  . Follow-up    Patient comes in for check up. Would like to discuss asthma issues.    Subjective:  CC: asthma flare  Presents today with a complaint of "asthma is bad in the fall ".  Reports that her asthma usually flares up in the fall but it is worse this year.  She is using her albuterol inhaler 2-3 times per week, with some nighttime awakenings.  This all started in August, which correlates to moving into a new apartment.  She reports that she did have the carpets cleaned before she moved in, however there were pets in the apartment.  She reports that when she takes her albuterol, she is usually totally relieved.  Her symptoms include wheezing and chest tightness.  This is a chronic problem, worse than usual.  Pertinent negatives include no fever, no chills, no shortness of breath.    Medical History Denaly has a past medical history of Acne and Asthma.   Outpatient Encounter Medications as of 01/29/2020  Medication Sig  . albuterol (VENTOLIN HFA) 108 (90 Base) MCG/ACT inhaler Inhale 2 puffs into the lungs every 6 (six) hours as needed for wheezing or shortness of breath.  . drospirenone-ethinyl estradiol (YASMIN) 3-0.03 MG tablet Take 1 tablet by mouth daily.  . [DISCONTINUED] albuterol (PROVENTIL HFA;VENTOLIN HFA) 108 (90 Base) MCG/ACT inhaler Inhale 2 puffs into the lungs every 6 (six) hours as needed for wheezing or shortness of breath.   No facility-administered encounter medications on file as of 01/29/2020.     Review of Systems  Constitutional: Negative for chills and fever.  Respiratory: Positive for chest tightness and wheezing. Negative for shortness of breath.   Cardiovascular: Negative for chest pain.  Gastrointestinal: Negative for abdominal pain.     Vitals BP 112/74   Pulse 87   Temp 97.8 F (36.6 C)   Wt 146 lb (66.2 kg)    SpO2 99%   BMI 26.70 kg/m   Objective:   Physical Exam Vitals and nursing note reviewed.  Constitutional:      General: She is not in acute distress.    Appearance: Normal appearance.  Cardiovascular:     Rate and Rhythm: Normal rate and regular rhythm.     Pulses: Normal pulses.     Heart sounds: Normal heart sounds.  Pulmonary:     Effort: Pulmonary effort is normal.     Breath sounds: Normal breath sounds. No wheezing.  Skin:    General: Skin is warm and dry.  Neurological:     Mental Status: She is alert and oriented to person, place, and time.  Psychiatric:        Mood and Affect: Mood normal.        Behavior: Behavior normal.        Thought Content: Thought content normal.        Judgment: Judgment normal.      Assessment and Plan   1. Mild intermittent asthma, unspecified whether complicated - albuterol (VENTOLIN HFA) 108 (90 Base) MCG/ACT inhaler; Inhale 2 puffs into the lungs every 6 (six) hours as needed for wheezing or shortness of breath.  Dispense: 18 g; Refill: 1   Detailed discussion concerning avoiding triggers.  Highly suspicious that this is related to the new apartment and the new carpet that she is now living in.  She reports her  symptoms are more prominent after stress, and right before bed when she is in her bedroom.  Encouraged her to consider researching additional methods to cleaning carpet.  Discussion around mild intermittent asthma versus mild persistent asthma:   if this continues, will consider adding a low-dose ICS with her SABA.  Guidelines for adding ICS reviewed.  Trigger avoidance is imperative.  Agrees with plan of care discussed today. Understands warning signs to seek further care: Chest pain, shortness of breath, worsening symptoms. Understands to follow-up in 1 month to assess severity, consider adding low-dose ICS with SABA.   Dorena Bodo, FNP-C

## 2020-01-29 NOTE — Patient Instructions (Signed)
Asthma Attack Prevention, Adult Although you may not be able to control the fact that you have asthma, you can take actions to prevent episodes of asthma (asthma attacks). These actions include:  Creating a written plan for managing and treating your asthma attacks (asthma action plan).  Monitoring your asthma.  Avoiding things that can irritate your airways or make your asthma symptoms worse (asthma triggers).  Taking your medicines as directed.  Acting quickly if you have signs or symptoms of an asthma attack. What are some ways to prevent an asthma attack? Create a plan Work with your health care provider to create an asthma action plan. This plan should include:  A list of your asthma triggers and how to avoid them.  A list of symptoms that you experience during an asthma attack.  Information about when to take medicine and how much medicine to take.  Information to help you understand your peak flow measurements.  Contact information for your health care providers.  Daily actions that you can take to control asthma. Monitor your asthma To monitor your asthma:  Use your peak flow meter every morning and every evening for 2-3 weeks. Record the results in a journal. A drop in your peak flow numbers on one or more days may mean that you are starting to have an asthma attack, even if you are not having symptoms.  When you have asthma symptoms, write them down in a journal.  Avoid asthma triggers Work with your health care provider to find out what your asthma triggers are. This can be done by:  Being tested for allergies.  Keeping a journal that notes when asthma attacks occur and what may have contributed to them.  Asking your health care provider whether other medical conditions make your asthma worse. Common asthma triggers include:  Dust.  Smoke. This includes campfire smoke and secondhand smoke from tobacco products.  Pet dander.  Trees, grasses or  pollens.  Very cold, dry, or humid air.  Mold.  Foods that contain high amounts of sulfites.  Strong smells.  Engine exhaust and air pollution.  Aerosol sprays and fumes from household cleaners.  Household pests and their droppings, including dust mites and cockroaches.  Certain medicines, including NSAIDs. Once you have determined your asthma triggers, take steps to avoid them. Depending on your triggers, you may be able to reduce the chance of an asthma attack by:  Keeping your home clean. Have someone dust and vacuum your home for you 1 or 2 times a week. If possible, have them use a high-efficiency particulate arrestance (HEPA) vacuum.  Washing your sheets weekly in hot water.  Using allergy-proof mattress covers and casings on your bed.  Keeping pets out of your home.  Taking care of mold and water problems in your home.  Avoiding areas where people smoke.  Avoiding using strong perfumes or odor sprays.  Avoid spending a lot of time outdoors when pollen counts are high and on very windy days.  Talking with your health care provider before stopping or starting any new medicines. Medicines Take over-the-counter and prescription medicines only as told by your health care provider. Many asthma attacks can be prevented by carefully following your medicine schedule. Taking your medicines correctly is especially important when you cannot avoid certain asthma triggers. Even if you are doing well, do not stop taking your medicine and do not take less medicine. Act quickly If an asthma attack happens, acting quickly can decrease how severe it is and   how long it lasts. Take these actions:  Pay attention to your symptoms. If you are coughing, wheezing, or having difficulty breathing, do not wait to see if your symptoms go away on their own. Follow your asthma action plan.  If you have followed your asthma action plan and your symptoms are not improving, call your health care  provider or seek immediate medical care at the nearest hospital. It is important to write down how often you need to use your fast-acting rescue inhaler. You can track how often you use an inhaler in your journal. If you are using your rescue inhaler more often, it may mean that your asthma is not under control. Adjusting your asthma treatment plan may help you to prevent future asthma attacks and help you to gain better control of your condition. How can I prevent an asthma attack when I exercise? Exercise is a common asthma trigger. To prevent asthma attacks during exercise:  Follow advice from your health care provider about whether you should use your fast-acting inhaler before exercising. Many people with asthma experience exercise-induced bronchoconstriction (EIB). This condition often worsens during vigorous exercise in cold, humid, or dry environments. Usually, people with EIB can stay very active by using a fast-acting inhaler before exercising.  Avoid exercising outdoors in very cold or humid weather.  Avoid exercising outdoors when pollen counts are high.  Warm up and cool down when exercising.  Stop exercising right away if asthma symptoms start. Consider taking part in exercises that are less likely to cause asthma symptoms such as:  Indoor swimming.  Biking.  Walking.  Hiking.  Playing football. This information is not intended to replace advice given to you by your health care provider. Make sure you discuss any questions you have with your health care provider. Document Revised: 02/17/2017 Document Reviewed: 08/22/2015 [employer] Patient Education  2020 [employer]  

## 2020-02-12 ENCOUNTER — Other Ambulatory Visit: Payer: Self-pay

## 2020-03-03 ENCOUNTER — Other Ambulatory Visit: Payer: Self-pay

## 2020-03-03 MED ORDER — DROSPIRENONE-ETHINYL ESTRADIOL 3-0.03 MG PO TABS: 1.0000 | ORAL_TABLET | Freq: Every day | ORAL | 0 refills | Status: DC

## 2020-03-03 NOTE — Telephone Encounter (Signed)
Former [facility] patient past due for CE since October. She scheduled appt with Dr. Melvenia Needles for 03/31/20 but is requesting refill on  bcp Rx.

## 2020-03-31 ENCOUNTER — Encounter: Payer: Self-pay | Admitting: Obstetrics and Gynecology

## 2020-03-31 ENCOUNTER — Ambulatory Visit (INDEPENDENT_AMBULATORY_CARE_PROVIDER_SITE_OTHER): Payer: BC Managed Care – PPO | Admitting: Obstetrics and Gynecology

## 2020-03-31 ENCOUNTER — Other Ambulatory Visit: Payer: Self-pay

## 2020-03-31 VITALS — BP 124/80 | Ht 62.0 in | Wt 152.0 lb

## 2020-03-31 DIAGNOSIS — Z01419 Encounter for gynecological examination (general) (routine) without abnormal findings: Secondary | ICD-10-CM | POA: Diagnosis not present

## 2020-03-31 DIAGNOSIS — Z113 Encounter for screening for infections with a predominantly sexual mode of transmission: Secondary | ICD-10-CM | POA: Diagnosis not present

## 2020-03-31 DIAGNOSIS — Z3009 Encounter for other general counseling and advice on contraception: Secondary | ICD-10-CM | POA: Diagnosis not present

## 2020-03-31 NOTE — Patient Instructions (Signed)
Intrauterine Device Information An intrauterine device (IUD) is a medical device that is inserted into the uterus to prevent pregnancy. It is a small, T-shaped device that has one or two nylon strings hanging down from it. The strings hang out of the lower part of the uterus (cervix) to allow for future IUD removal. There are two types of IUDs:  Hormone IUD. This type of IUD is made of plastic and contains the hormone progestin (synthetic progesterone). A hormone IUD may last 3-5 years.  Copper IUD. This type of IUD has copper wire wrapped around it. A copper IUD may last up to 10 years. How is an IUD inserted? An IUD is inserted through the vagina, through the cervix, and into the uterus with a minor medical procedure. The procedure for IUD insertion may vary among health care providers and hospitals. How does an IUD work? Synthetic progesterone in a hormonal IUD prevents pregnancy by:  Thickening cervical mucus to prevent sperm from entering the uterus.  Thinning the uterine lining to prevent a fertilized egg from being implanted there. Copper in a copper IUD prevents pregnancy by making the uterus and fallopian tubes produce a fluid that kills sperm. What are the advantages of an IUD? Advantages of either type of IUD An IUD:  Is highly effective in preventing pregnancy.  Is reversible. You can become pregnant shortly after the IUD is removed.  Is low-maintenance and can stay in place for a long time.  Has no estrogen-related side effects.  Can be used when breastfeeding.  Is not associated with weight gain.  Can be inserted right after childbirth, an abortion, or a miscarriage. Advantages of a hormone IUD  If it is inserted within 7 days of your period starting, it works right after it has been inserted. If the hormone IUD is inserted at any other time in your cycle, you will need to use a backup method of birth control for 7 days after insertion.  It can make menstrual periods  lighter or stop completely.  It can reduce menstrual cramping and other discomforts from menstrual periods.  It can be used for 3-5 years, depending on which IUD you have. Advantages of a copper IUD  It works right after it is inserted.  It can be used as a form of emergency birth control if it is inserted within 5 days after having unprotected sex.  It does not interfere with your body's natural hormones.  It can be used for up to 10 years. What are the disadvantages of an IUD?  An IUD may cause irregular menstrual bleeding for a period of time after insertion.  It is common to have pain during insertion and have cramping and vaginal bleeding after insertion.  An IUD may cut the uterus (uterine perforation) when it is inserted. This is rare.  Pelvic inflammatory disease (PID) may happen after insertion of an IUD. PID is an infection in the uterus and fallopian tubes. The IUD does not cause the infection. The infection is usually from an unknown sexually transmitted infection (STI). This is rare, and it usually happens during the first 20 days after the IUD is inserted.  A copper IUD can make your menstrual flow heavier and more painful.  IUDs cannot prevent sexually transmitted infections (STIs). How is an IUD removed?   You will lie on your back with your knees bent and your feet in footrests (stirrups).  A device will be inserted into your vagina to spread apart the vaginal walls (  speculum). This will allow your health care provider to see the strings attached to the IUD.  Your health care provider will use a small instrument (forceps) to grasp the IUD strings and will pull firmly until the IUD is removed. You may have some discomfort when the IUD is removed. Your health care provider may recommend taking over-the-counter pain relievers, such as ibuprofen, before the procedure. You may also have minor spotting for a few days after the procedure. The procedure for IUD removal may  vary among health care providers and hospitals. Is an IUD right for me? If you are interested in an IUD, discuss it with your health care provider. He or she will make sure you are a good candidate for an IUD and will let you know more about the advantages, disadvantage, and possible side effects. This will allow you to make a decision about the device. Summary  An intrauterine device (IUD) is a medical device that is inserted in the uterus to prevent pregnancy. It is a small, T-shaped device that has one or two nylon strings hanging down from it.  A hormone IUD contains the hormone progestin (synthetic progesterone). A copper IUD has copper wire wrapped around it.  Synthetic progesterone in a hormone IUD prevents pregnancy by thickening cervical mucus and thinning the walls of the uterus. Copper in a copper IUD prevents pregnancy by making the uterus and fallopian tubes produce a fluid that kills sperm.  A hormone IUD can be left in place for 3-5 years. A copper IUD can be left in place for up to 10 years.  An IUD is inserted and removed by a health care provider. You may feel some pain during insertion and removal. Your health care provider may recommend taking over-the-counter pain medicine, such as ibuprofen, before an IUD procedure. This information is not intended to replace advice given to you by your health care provider. Make sure you discuss any questions you have with your health care provider. Document Revised: 09/18/2019 Document Reviewed: 09/18/2019 Elsevier Patient Education  2021 [employer] Inc[employer]  Intrauterine Device Insertion, Care After This sheet gives you information about how to care for yourself after your procedure. Your health care provider may also give you more specific instructions. If you have problems or questions, contact your health care provider. What can I expect after the procedure? After the procedure, it is common to have:  Cramps and pain in the  abdomen.  Bleeding. It may be light or heavy. This may last for a few days.  Lower back pain.  Dizziness.  Headaches.  Nausea. Follow these instructions at home:  Before resuming sexual activity, check to make sure that you can feel the IUD string or strings. You should be able to feel the end of the string below the opening of your cervix. If your IUD string is in place, you may resume sexual activity. ? If you had a hormonal IUD inserted more than 7 days after your most recent period started, you will need to use a backup method of birth control for 7 days after IUD insertion. Ask your health care provider whether this applies to you.  Continue to check that the IUD is still in place by feeling for the strings after every menstrual period, or once a month.  An IUD will not protect you from sexually transmitted infections (STIs). Use methods to prevent the exchange of body fluids between partners (barrier protection) every time you have sex. Barrier protection can be used during  oral, vaginal, or anal sex. Commonly used barrier methods include: ? Female condom. ? Female condom. ? Dental dam.  Take over-the-counter and prescription medicines only as told by your health care provider.  Keep all follow-up visits as told by your health care provider. This is important.   Contact a health care provider if:  You feel light-headed or weak.  You have any of the following problems with your IUD string or strings: ? The string bothers or hurts you or your sexual partner. ? You cannot feel the string. ? The string has gotten longer.  You can feel the IUD in your vagina.  You think you may be pregnant, or you miss your menstrual period.  You think you may have a sexually transmitted infection (STI). Get help right away if:  You have flu-like symptoms, such as tiredness (fatigue) and muscle aches.  You have a fever and chills.  You have bleeding that is heavier or lasts longer than a  normal menstrual cycle.  You have abnormal or bad-smelling discharge from your vagina.  You develop abdominal pain that is new, is getting worse, or is not in the same area of earlier cramping and pain.  You have pain during sexual activity. Summary  After the procedure, it is common to have cramps and pain in the abdomen. It is also common to have light bleeding or heavier bleeding that is like your menstrual period.  Continue to check that the IUD is still in place by feeling for the strings after every menstrual period, or once a month.  Keep all follow-up visits as told by your health care provider. This is important.  Contact your health care provider if you have problems with your IUD strings, such as the string getting longer or bothering you or your sexual partner. This information is not intended to replace advice given to you by your health care provider. Make sure you discuss any questions you have with your health care provider. Document Revised: 02/26/2019 Document Reviewed: 02/26/2019 Elsevier Patient Education  2021 [employer].  Contraception Choices Contraception, also called birth control, refers to methods or devices that prevent pregnancy. Hormonal methods Contraceptive implant A contraceptive implant is a thin, plastic tube that contains a hormone that prevents pregnancy. It is different from an intrauterine device (IUD). It is inserted into the upper part of the arm by a health care provider. Implants can be effective for up to 3 years. Progestin-only injections Progestin-only injections are injections of progestin, a synthetic form of the hormone progesterone. They are given every 3 months by a health care provider. Birth control pills Birth control pills are pills that contain hormones that prevent pregnancy. They must be taken once a day, preferably at the same time each day. A prescription is needed to use this method of contraception. Birth control patch The  birth control patch contains hormones that prevent pregnancy. It is placed on the skin and must be changed once a week for three weeks and removed on the fourth week. A prescription is needed to use this method of contraception. Vaginal ring A vaginal ring contains hormones that prevent pregnancy. It is placed in the vagina for three weeks and removed on the fourth week. After that, the process is repeated with a new ring. A prescription is needed to use this method of contraception. Emergency contraceptive Emergency contraceptives prevent pregnancy after unprotected sex. They come in pill form and can be taken up to 5 days after sex. They work  best the sooner they are taken after having sex. Most emergency contraceptives are available without a prescription. This method should not be used as your only form of birth control.   Barrier methods Female condom A female condom is a thin sheath that is worn over the penis during sex. Condoms keep sperm from going inside a woman's body. They can be used with a sperm-killing substance (spermicide) to increase their effectiveness. They should be thrown away after one use. Female condom A female condom is a soft, loose-fitting sheath that is put into the vagina before sex. The condom keeps sperm from going inside a woman's body. They should be thrown away after one use. Diaphragm A diaphragm is a soft, dome-shaped barrier. It is inserted into the vagina before sex, along with a spermicide. The diaphragm blocks sperm from entering the uterus, and the spermicide kills sperm. A diaphragm should be left in the vagina for 6-8 hours after sex and removed within 24 hours. A diaphragm is prescribed and fitted by a health care provider. A diaphragm should be replaced every 1-2 years, after giving birth, after gaining more than 15 lb (6.8 kg), and after pelvic surgery. Cervical cap A cervical cap is a round, soft latex or plastic cup that fits over the cervix. It is inserted  into the vagina before sex, along with spermicide. It blocks sperm from entering the uterus. The cap should be left in place for 6-8 hours after sex and removed within 48 hours. A cervical cap must be prescribed and fitted by a health care provider. It should be replaced every 2 years. Sponge A sponge is a soft, circular piece of polyurethane foam with spermicide in it. The sponge helps block sperm from entering the uterus, and the spermicide kills sperm. To use it, you make it wet and then insert it into the vagina. It should be inserted before sex, left in for at least 6 hours after sex, and removed and thrown away within 30 hours. Spermicides Spermicides are chemicals that kill or block sperm from entering the cervix and uterus. They can come as a cream, jelly, suppository, foam, or tablet. A spermicide should be inserted into the vagina with an applicator at least 10-15 minutes before sex to allow time for it to work. The process must be repeated every time you have sex. Spermicides do not require a prescription.   Intrauterine contraception Intrauterine device (IUD) An IUD is a T-shaped device that is put in a woman's uterus. There are two types:  Hormone IUD.This type contains progestin, a synthetic form of the hormone progesterone. This type can stay in place for 3-5 years.  Copper IUD.This type is wrapped in copper wire. It can stay in place for 10 years. Permanent methods of contraception Female tubal ligation In this method, a woman's fallopian tubes are sealed, tied, or blocked during surgery to prevent eggs from traveling to the uterus. Hysteroscopic sterilization In this method, a small, flexible insert is placed into each fallopian tube. The inserts cause scar tissue to form in the fallopian tubes and block them, so sperm cannot reach an egg. The procedure takes about 3 months to be effective. Another form of birth control must be used during those 3 months. Female sterilization This is  a procedure to tie off the tubes that carry sperm (vasectomy). After the procedure, the man can still ejaculate fluid (semen). Another form of birth control must be used for 3 months after the procedure. Natural planning methods Natural  family planning In this method, a couple does not have sex on days when the woman could become pregnant. Calendar method In this method, the woman keeps track of the length of each menstrual cycle, identifies the days when pregnancy can happen, and does not have sex on those days. Ovulation method In this method, a couple avoids sex during ovulation. Symptothermal method This method involves not having sex during ovulation. The woman typically checks for ovulation by watching changes in her temperature and in the consistency of cervical mucus. Post-ovulation method In this method, a couple waits to have sex until after ovulation. Where to find more information  Centers for Disease Control and Prevention: [employer] Summary  Contraception, also called birth control, refers to methods or devices that prevent pregnancy.  Hormonal methods of contraception include implants, injections, pills, patches, vaginal rings, and emergency contraceptives.  Barrier methods of contraception can include female condoms, female condoms, diaphragms, cervical caps, sponges, and spermicides.  There are two types of IUDs (intrauterine devices). An IUD can be put in a woman's uterus to prevent pregnancy for 3-5 years.  Permanent sterilization can be done through a procedure for males and females. Natural family planning methods involve nothaving sex on days when the woman could become pregnant. This information is not intended to replace advice given to you by your health care provider. Make sure you discuss any questions you have with your health care provider. Document Revised: 08/12/2019 Document Reviewed: 08/12/2019 Elsevier Patient Education  2021 [employer].

## 2020-03-31 NOTE — Progress Notes (Signed)
Caresse Sedivy [birth year] [id number]  SUBJECTIVE:  25 y.o. G0P0000 female for annual routine gynecologic exam and Pap smear. She has no gynecologic concerns.  She does want to consider new birth control method as she has been on the pill for about 5 years now.  She did run out of her prescription a few months ago and was off of it for about a month and noticed an improvement in libido and she lost some weight as well so she would like to try something else.  Monogamous relationship, 1 sexual partner.   Current Outpatient Medications  Medication Sig Dispense Refill  . albuterol (VENTOLIN HFA) 108 (90 Base) MCG/ACT inhaler Inhale 2 puffs into the lungs every 6 (six) hours as needed for wheezing or shortness of breath. 18 g 1  . drospirenone-ethinyl estradiol (YASMIN) 3-0.03 MG tablet Take 1 tablet by mouth daily. 84 tablet 0   No current facility-administered medications for this visit.   Allergies: Patient has no known allergies.  Patient's last menstrual period was 03/28/2020.  Past medical history,surgical history, problem list, medications, allergies, family history and social history were all reviewed and documented as reviewed in the EPIC chart.  ROS: Pertinent positives and negatives as reviewed in HPI   OBJECTIVE:  BP 124/80 (BP Location: Right Arm, Patient Position: Sitting, Cuff Size: Normal)   Ht 5\' 2"  (1.575 m)   Wt 152 lb (68.9 kg)   LMP 03/28/2020   BMI 27.80 kg/m  The patient appears well, alert, oriented, in no distress. ENT normal.  Neck supple. No cervical or supraclavicular adenopathy or thyromegaly.  Lungs are clear, good air entry, no wheezes, rhonchi or rales. S1 and S2 normal, no murmurs, regular rate and rhythm.  Abdomen soft without tenderness, guarding, mass or organomegaly.  Neurological is normal, no focal findings.  BREAST EXAM: breasts appear normal, no suspicious masses, no skin or nipple changes or axillary nodes  PELVIC EXAM: VULVA: normal  appearing vulva with no masses, tenderness or lesions, VAGINA: normal appearing vagina with normal color and discharge, no lesions, CERVIX: normal appearing cervix without discharge or lesions, UTERUS: uterus is normal size, shape, consistency and nontender, ADNEXA: normal adnexa in size, nontender and no masses GC/Chlamydia probe obtained  Chaperone: 05/26/2020 [place] present during the examination  ASSESSMENT:  25 y.o. G0P0000 here for annual gynecologic exam  PLAN:   1.  Contraception counseling.  We discussed other options including patch, NuvaRing, Depo-Provera injection, IUDs, Nexplanon.  After discussing the pros and cons of each she was potentially interested in the levonorgestrel IUD, we discussed 3 vs 5-year devices, some of the expected discomfort with insertion and bleeding profile afterwards.  I will leave information in the MyChart for her to review and she will let 30 know how she would like to proceed.  We will plan to continue with the OCPs for now and will let us know if she needs a refill. 2. Pap smear 12/2017.  No history of abnormal Pap smears.  Pap smear due at next annual visit following the current guidelines recommending the 3 year interval. 3. Breast exam normal.  Encourage SBE/awareness. 4. We did review her vaccination history and discovered that she has not yet had the HPV vaccination series which we discussed today.  She did leave the exam room before we were to revisit starting the series today, we can discuss this again at a future visit. 5. Health maintenance.  No labs today, reviewing her history, weight fluctuation has been within her  normal range over the past few years.  No signs or symptoms to warrant older adult screening labs at this time.  Return annually or sooner, prn.  Theresia Majors MD 03/31/20

## 2020-04-01 NOTE — Addendum Note (Signed)
Addended by: Tito Dine on: 04/01/2020 08:15 AM   Modules accepted: Orders

## 2020-04-02 LAB — C. TRACHOMATIS/N. GONORRHOEAE RNA
C. trachomatis RNA, TMA: NOT DETECTED
N. gonorrhoeae RNA, TMA: NOT DETECTED

## 2020-05-18 ENCOUNTER — Other Ambulatory Visit: Payer: Self-pay | Admitting: Obstetrics and Gynecology

## 2020-05-19 NOTE — Telephone Encounter (Signed)
Annual exam was on 03/31/20

## 2020-07-24 ENCOUNTER — Ambulatory Visit: Payer: BC Managed Care – PPO | Admitting: Family Medicine

## 2020-07-30 ENCOUNTER — Other Ambulatory Visit: Payer: Self-pay

## 2020-07-30 ENCOUNTER — Ambulatory Visit: Payer: BC Managed Care – PPO | Admitting: Family Medicine

## 2020-07-30 ENCOUNTER — Encounter: Payer: Self-pay | Admitting: Family Medicine

## 2020-07-30 VITALS — BP 108/70 | HR 83 | Temp 98.2°F | Ht 62.0 in | Wt 161.0 lb

## 2020-07-30 DIAGNOSIS — J4531 Mild persistent asthma with (acute) exacerbation: Secondary | ICD-10-CM

## 2020-07-30 DIAGNOSIS — J4520 Mild intermittent asthma, uncomplicated: Secondary | ICD-10-CM | POA: Diagnosis not present

## 2020-07-30 MED ORDER — BUDESONIDE-FORMOTEROL FUMARATE 80-4.5 MCG/ACT IN AERO: INHALATION_SPRAY | RESPIRATORY_TRACT | 12 refills | Status: DC

## 2020-07-30 MED ORDER — MOMETASONE FUROATE 0.1 % EX CREA: TOPICAL_CREAM | CUTANEOUS | 1 refills | Status: DC

## 2020-07-30 MED ORDER — MONTELUKAST SODIUM 10 MG PO TABS: 10.0000 mg | ORAL_TABLET | Freq: Every day | ORAL | 3 refills | Status: DC

## 2020-07-30 MED ORDER — ALBUTEROL SULFATE HFA 108 (90 BASE) MCG/ACT IN AERS: 2.0000 | INHALATION_SPRAY | RESPIRATORY_TRACT | 1 refills | Status: DC | PRN

## 2020-07-30 NOTE — Progress Notes (Signed)
   Subjective:    Patient ID: Kaitlyn Munoz, female    DOB: 07/30/1995, 25 y.o.   MRN: [id number]  HPIfollow up on asthma. Pt states she has had trouble with asthma since February. Has to use albuterol inhaler every day.  Patient with intermittent asthma up until February where it became every single day In addition to this having some difficulties with breathing shortness of breath some coughing Intermittent wheezing albuterol helps to some degree Does not smoke or drink Does get allergies worse in the spring and in the late summer   Review of Systems     Objective:   Physical Exam  Lungs are clear with scattered wheezes heart regular pulse normal extremities no edema  Pathophysiology discussed, questions answered    Assessment & Plan:  Reactive airway Moderate persistent asthma Will need a combination inhaler of steroid along with long-acting bronchodilator Also rescue inhaler In addition to Singulair side effects discussed including potential for depression Has never had depression with this medicine before Follow-up in 6 weeks sooner problems

## 2020-10-25 ENCOUNTER — Other Ambulatory Visit: Payer: Self-pay | Admitting: Family Medicine

## 2020-10-30 ENCOUNTER — Encounter: Payer: Self-pay | Admitting: Family Medicine

## 2020-10-30 ENCOUNTER — Ambulatory Visit: Payer: BC Managed Care – PPO | Admitting: Family Medicine

## 2020-11-16 ENCOUNTER — Other Ambulatory Visit: Payer: Self-pay | Admitting: Family Medicine

## 2020-12-05 ENCOUNTER — Other Ambulatory Visit: Payer: Self-pay | Admitting: Family Medicine

## 2021-01-16 ENCOUNTER — Other Ambulatory Visit: Payer: Self-pay | Admitting: Family Medicine

## 2021-01-18 NOTE — Telephone Encounter (Signed)
Sent my chart message to schedule appointment.

## 2021-01-26 NOTE — Telephone Encounter (Signed)
Second request to schedule appointment 01/26/21

## 2021-03-11 NOTE — Telephone Encounter (Signed)
Sent two message to schedule medication follow up but no response

## 2021-04-01 ENCOUNTER — Ambulatory Visit: Payer: BC Managed Care – PPO | Admitting: Obstetrics and Gynecology

## 2021-04-26 ENCOUNTER — Other Ambulatory Visit: Payer: Self-pay

## 2021-04-30 ENCOUNTER — Other Ambulatory Visit: Payer: Self-pay | Admitting: *Deleted

## 2021-04-30 MED ORDER — DROSPIRENONE-ETHINYL ESTRADIOL 3-0.03 MG PO TABS: 1.0000 | ORAL_TABLET | Freq: Every day | ORAL | 0 refills | Status: DC

## 2021-04-30 NOTE — Telephone Encounter (Signed)
-----   Message from Jerilynn Mages sent at 04/30/2021 11:53 AM EST ----- Left message to call and schedule ----- Message ----- From: Aura Camps, RMA Sent: 04/30/2021  11:49 AM EST To: [facility] App[facility]  Patient left a message about office denying refill. She is overdue for annual exam. Please call to schedule.   Thanks!

## 2021-04-30 NOTE — Telephone Encounter (Signed)
Patient annual exam scheduled on 05/13/21, last annual exam was 03/31/2020

## 2021-04-30 NOTE — Telephone Encounter (Signed)
Please schedule for annual exam

## 2021-05-07 ENCOUNTER — Ambulatory Visit: Payer: BC Managed Care – PPO | Admitting: Family Medicine

## 2021-05-07 ENCOUNTER — Other Ambulatory Visit: Payer: Self-pay

## 2021-05-07 VITALS — BP 133/86 | HR 69 | Temp 98.7°F | Ht 62.0 in | Wt 174.0 lb

## 2021-05-07 DIAGNOSIS — J4520 Mild intermittent asthma, uncomplicated: Secondary | ICD-10-CM | POA: Diagnosis not present

## 2021-05-07 DIAGNOSIS — Z23 Encounter for immunization: Secondary | ICD-10-CM | POA: Diagnosis not present

## 2021-05-07 MED ORDER — BUDESONIDE-FORMOTEROL FUMARATE 160-4.5 MCG/ACT IN AERO: 2.0000 | INHALATION_SPRAY | Freq: Two times a day (BID) | RESPIRATORY_TRACT | 3 refills | Status: DC

## 2021-05-07 MED ORDER — ALBUTEROL SULFATE HFA 108 (90 BASE) MCG/ACT IN AERS: 2.0000 | INHALATION_SPRAY | RESPIRATORY_TRACT | 3 refills | Status: AC | PRN

## 2021-05-07 NOTE — Progress Notes (Signed)
° °  Subjective:    Patient ID: Kaitlyn Munoz, female    DOB: [birth year], 26 y.o.   MRN: [id number]  Asthma She complains of chest tightness, cough, difficulty breathing and wheezing. This is a chronic problem. The cough is non-productive and dry. Her past medical history is significant for asthma.  Patient had frequent flareups of asthma Not causing her any severe setbacks Finds himself wheezing intermittently and coughing at times short of breath   Review of Systems  Respiratory:  Positive for cough and wheezing.       Objective:   Physical Exam  Gen-NAD not toxic TMS-normal bilateral T- normal no redness Chest-CTA respiratory rate normal no crackles CV RRR no murmur Skin-warm dry Neuro-grossly normal       Assessment & Plan:  Asthma Poor control Bump up dose of Symbicort Albuterol for rescue inhaler If progressive symptoms or worse to follow-up Recheck in several months Give [place] feedback within 1 month how the new dose is doing May need allergist for injections if ongoing troubles

## 2021-05-07 NOTE — Patient Instructions (Signed)
Asthma, Adult °Asthma is a long-term (chronic) condition that causes recurrent episodes in which the airways become tight and narrow. The airways are the passages that lead from the nose and mouth down into the lungs. Asthma episodes, also called asthma attacks, can cause coughing, wheezing, shortness of breath, and chest pain. The airways can also fill with mucus. During an attack, it can be difficult to breathe. Asthma attacks can range from minor to life threatening. °Asthma cannot be cured, but medicines and lifestyle changes can help control it and treat acute attacks. °What are the causes? °This condition is believed to be caused by inherited (genetic) and environmental factors, but its exact cause is not known. °There are many things that can bring on an asthma attack or make asthma symptoms worse (triggers). Asthma triggers are different for each person. Common triggers include: °Mold. °Dust. °Cigarette smoke. °Cockroaches. °Things that can cause allergy symptoms (allergens), such as animal dander or pollen from trees or grass. °Air pollutants such as household cleaners, wood smoke, smog, or chemical odors. °Cold air, weather changes, and winds (which increase molds and pollen in the air). °Strong emotional expressions such as crying or laughing hard. °Stress. °Certain medicines (such as aspirin) or types of medicines (such as beta-blockers). °Sulfites in foods and drinks. Foods and drinks that may contain sulfites include dried fruit, potato chips, and sparkling grape juice. °Infections or inflammatory conditions such as the flu, a cold, or inflammation of the nasal membranes (rhinitis). °Gastroesophageal reflux disease (GERD). °Exercise or strenuous activity. °What are the signs or symptoms? °Symptoms of this condition may occur right after asthma is triggered or many hours later. Symptoms include: °Wheezing. This can sound like whistling when you breathe. °Excessive nighttime or early morning  coughing. °Frequent or severe coughing with a common cold. °Chest tightness. °Shortness of breath. °Tiredness (fatigue) with minimal activity. °How is this diagnosed? °This condition is diagnosed based on: °Your medical history. °A physical exam. °Tests, which may include: °Lung function studies and pulmonary studies (spirometry). These tests can evaluate the flow of air in your lungs. °Allergy tests. °Imaging tests, such as X-rays. °How is this treated? °There is no cure for this condition, but treatment can help control your symptoms. Treatment for asthma usually involves: °Identifying and avoiding your asthma triggers. °Using medicines to control your symptoms. Generally, two types of medicines are used to treat asthma: °Controller medicines. These help prevent asthma symptoms from occurring. They are usually taken every day. °Fast-acting reliever or rescue medicines. These quickly relieve asthma symptoms by widening the narrow and tight airways. They are used as needed and provide short-term relief. °Using supplemental oxygen. This may be needed during a severe episode. °Using other medicines, such as: °Allergy medicines, such as antihistamines, if your asthma attacks are triggered by allergens. °Immune medicines (immunomodulators). These are medicines that help control the immune system. °Creating an asthma action plan. An asthma action plan is a written plan for managing and treating your asthma attacks. This plan includes: °A list of your asthma triggers and how to avoid them. °Information about when medicines should be taken and when their dosage should be changed. °Instructions about using a device called a peak flow meter. A peak flow meter measures how well the lungs are working and the severity of your asthma. It helps you monitor your condition. °Follow these instructions at home: °Controlling your home environment °Control your home environment in the following ways to help avoid triggers and prevent  asthma attacks: °Change your heating   and air conditioning filter regularly. °Limit your use of fireplaces and wood stoves. °Get rid of pests (such as roaches and mice) and their droppings. °Throw away plants if you see mold on them. °Clean floors and dust surfaces regularly. Use unscented cleaning products. °Try to have someone else vacuum for you regularly. Stay out of rooms while they are being vacuumed and for a short while afterward. If you vacuum, use a dust mask from a hardware store, a double-layered or microfilter vacuum cleaner bag, or a vacuum cleaner with a HEPA filter. °Replace carpet with wood, tile, or vinyl flooring. Carpet can trap dander and dust. °Use allergy-proof pillows, mattress covers, and box spring covers. °Keep your bedroom a trigger-free room. °Avoid pets and keep windows closed when allergens are in the air. °Wash beddings every week in hot water and dry them in a dryer. °Use blankets that are made of polyester or cotton. °Clean bathrooms and kitchens with bleach. If possible, have someone repaint the walls in these rooms with mold-resistant paint. Stay out of the rooms that are being cleaned and painted. °Wash your hands often with soap and water. If soap and water are not available, use hand sanitizer. °Do not allow anyone to smoke in your home. °General instructions °Take over-the-counter and prescription medicines only as told by your health care provider. °Speak with your health care provider if you have questions about how or when to take the medicines. °Make note if you are requiring more frequent dosages. °Do not use any products that contain nicotine or tobacco, such as cigarettes and e-cigarettes. If you need help quitting, ask your health care provider. Also, avoid being exposed to secondhand smoke. °Use a peak flow meter as told by your health care provider. Record and keep track of the readings. °Understand and use the asthma action plan to help minimize, or stop an asthma  attack, without needing to seek medical care. °Make sure you stay up to date on your yearly vaccinations as told by your health care provider. This may include vaccines for the flu and pneumonia. °Avoid outdoor activities when allergen counts are high and when air quality is low. °Wear a ski mask that covers your nose and mouth during outdoor winter activities. Exercise indoors on cold days if you can. °Warm up before exercising, and take time for a cool-down period after exercise. °Keep all follow-up visits as told by your health care provider. This is important. °Where to find more information °For information about asthma, turn to the Centers for Disease Control and Prevention at www.cdc.gov/asthma/faqs °For air quality information, turn to AirNow at airnow.gov °Contact a health care provider if: °You have wheezing, shortness of breath, or a cough even while you are taking medicine to prevent attacks. °The mucus you cough up (sputum) is thicker than usual. °Your sputum changes from clear or white to yellow, green, gray, or bloody. °Your medicines are causing side effects, such as a rash, itching, swelling, or trouble breathing. °You need to use a reliever medicine more than 2-3 times a week. °Your peak flow reading is still at 50-79% of your personal best after following your action plan for 1 hour. °You have a fever. °Get help right away if: °You are getting worse and do not respond to treatment during an asthma attack. °You are short of breath when at rest or when doing very little physical activity. °You have difficulty eating, drinking, or talking. °You have chest pain or tightness. °You develop a fast heartbeat or   palpitations. °You have a bluish color to your lips or fingernails. °You are light-headed or dizzy, or you faint. °Your peak flow reading is less than 50% of your personal best. °You feel too tired to breathe normally. °Summary °Asthma is a long-term (chronic) condition that causes recurrent  episodes in which the airways become tight and narrow. These episodes can cause coughing, wheezing, shortness of breath, and chest pain. °Asthma cannot be cured, but medicines and lifestyle changes can help control it and treat acute attacks. °Make sure you understand how to avoid triggers and how and when to use your medicines. °Asthma attacks can range from minor to life threatening. Get help right away if you have an asthma attack and do not respond to treatment with your usual rescue medicines. °This information is not intended to replace advice given to you by your health care provider. Make sure you discuss any questions you have with your health care provider. °Document Revised: 12/06/2019 Document Reviewed: 07/10/2019 °Elsevier Patient Education © 2022 [employer] ° °

## 2021-05-10 ENCOUNTER — Encounter: Payer: Self-pay | Admitting: Family Medicine

## 2021-05-10 ENCOUNTER — Other Ambulatory Visit: Payer: Self-pay

## 2021-05-10 ENCOUNTER — Ambulatory Visit: Payer: BC Managed Care – PPO | Admitting: Family Medicine

## 2021-05-10 VITALS — BP 123/85 | HR 101 | Temp 98.1°F | Wt 174.4 lb

## 2021-05-10 DIAGNOSIS — F321 Major depressive disorder, single episode, moderate: Secondary | ICD-10-CM | POA: Diagnosis not present

## 2021-05-10 DIAGNOSIS — F411 Generalized anxiety disorder: Secondary | ICD-10-CM

## 2021-05-10 MED ORDER — SERTRALINE HCL 50 MG PO TABS: 50.0000 mg | ORAL_TABLET | Freq: Every day | ORAL | 3 refills | Status: DC

## 2021-05-10 NOTE — Progress Notes (Signed)
° °  Subjective:    Patient ID: Kaitlyn Munoz, female    DOB: [birth year], 26 y.o.   MRN: [id number]  HPI Pt here to discuss mental health. Pt states she has had anxiety for year. Significant chronic anxiety been going on for months made worse by recent changes.  She is living with her boyfriend which she states has been a good thing But she does admit that she is also lonely and at times depressed living in [place] with her family closer to here. She also lost her job unexpectedly although it was not a job that she particularly enjoyed she felt she was doing a good job .  This has been hard for her.  Has recently been depressed; lost job recently. No suicidal thoughts at this time.    Review of Systems     Objective:   Physical Exam  Lungs clear heart regular pulse normal BP good      Assessment & Plan:  Generalized anxiety disorder Increased social anxiety when around large crowds Counseling recommended Patient will look into this in the [facility] region Depression as well Not suicidal Zoloft 50 mg one half daily for the first week then 1 daily thereafter caution nausea but after that should do better on the medication.  Notify us if any particular problems do a virtual visit in 3 to 4 weeks  Patient will also give [place] an update on her asthma at that time

## 2021-05-12 NOTE — Progress Notes (Signed)
° °  Kaitlyn Munoz 11-Dec-1995 AN:[id number]   History:  26 y.o. G0 presents for annual exam. c/o low libido since being on Syeda OCP.  Gynecologic History Patient's last menstrual period was 04/29/2021. Period Cycle (Days): 28 Period Duration (Days): 4 Period Pattern: Regular Menstrual Flow: Light Dysmenorrhea: None Contraception/Family planning: OCP (estrogen/progesterone) Sexually active: yes Last Pap: 2019. Results were: normal   Obstetric History OB History  Gravida Para Term Preterm AB Living  0 0 0 0 0 0  SAB IAB Ectopic Multiple Live Births  0 0 0 0 0     The following portions of the patient's history were reviewed and updated as appropriate: allergies, current medications, past family history, past medical history, past social history, past surgical history, and problem list.  Review of Systems Pertinent items noted in HPI and remainder of comprehensive ROS otherwise negative.   Past medical history, past surgical history, family history and social history were all reviewed and documented in the EPIC chart.   Exam:  Vitals:   05/13/21 1052  BP: 114/68  Weight: 171 lb (77.6 kg)  Height: 5\' 2"  (1.575 m)   Body mass index is 31.28 kg/m.  General appearance:  Normal Thyroid:  Symmetrical, normal in size, without palpable masses or nodularity. Respiratory  Auscultation:  Clear without wheezing or rhonchi Cardiovascular  Auscultation:  Regular rate, without rubs, murmurs or gallops  Edema/varicosities:  Not grossly evident Abdominal  Soft,nontender, without masses, guarding or rebound.  Liver/spleen:  No organomegaly noted  Hernia:  None appreciated  Skin  Inspection:  Grossly normal Breasts: Examined lying and sitting.   Right: Without masses, retractions, nipple discharge or axillary adenopathy.   Left: Without masses, retractions, nipple discharge or axillary adenopathy. Genitourinary   Inguinal/mons:  Normal without inguinal adenopathy  External  genitalia:  Normal appearing vulva with no masses, tenderness, or lesions  BUS/Urethra/Skene's glands:  Normal without masses or exudate  Vagina:  Normal appearing with normal color and discharge, no lesions  Cervix:  Normal appearing without discharge or lesions  Uterus:  Normal in size, shape and contour.  Mobile, nontender  Adnexa/parametria:     Rt: Normal in size, without masses or tenderness.   Lt: Normal in size, without masses or tenderness.  Anus and perineum: Normal  Digital rectal exam: Normal sphincter tone without palpated masses or tenderness  Patient informed chaperone available to be present for breast and pelvic exam. Patient has requested no chaperone to be present. Patient has been advised what will be completed during breast and pelvic exam.   Assessment/Plan:   -Well woman exam -Pap with reflex testing and gc/ct/trich  -Mammo at 41 -Switch OCPs due to low libido to [facility]   Discussed SBE, pap screening as directed/appropriate. Recommend 123mins of exercise weekly, including weight bearing exercise. Encouraged the use of seatbelts and sunscreen. Return in 1 year for annual or as needed.   Kaitlyn Munoz B WHNP-BC 11:10 AM 05/13/2021

## 2021-05-13 ENCOUNTER — Ambulatory Visit (INDEPENDENT_AMBULATORY_CARE_PROVIDER_SITE_OTHER): Payer: BC Managed Care – PPO | Admitting: Radiology

## 2021-05-13 ENCOUNTER — Other Ambulatory Visit (HOSPITAL_COMMUNITY)
Admission: RE | Admit: 2021-05-13 | Discharge: 2021-05-13 | Disposition: A | Discharge status: Home / Self Care | Payer: BC Managed Care – PPO | Source: Ambulatory Visit | Attending: Radiology | Admitting: Radiology

## 2021-05-13 ENCOUNTER — Encounter: Payer: Self-pay | Admitting: Radiology

## 2021-05-13 ENCOUNTER — Other Ambulatory Visit: Payer: Self-pay

## 2021-05-13 VITALS — BP 114/68 | Ht 62.0 in | Wt 171.0 lb

## 2021-05-13 DIAGNOSIS — Z01419 Encounter for gynecological examination (general) (routine) without abnormal findings: Secondary | ICD-10-CM | POA: Diagnosis not present

## 2021-05-13 MED ORDER — NORETHIN ACE-ETH ESTRAD-FE 1-20 MG-MCG PO TABS: 1.0000 | ORAL_TABLET | Freq: Every day | ORAL | 4 refills | Status: AC

## 2021-05-14 LAB — CYTOLOGY - PAP
Adequacy: ABSENT
Chlamydia: NEGATIVE
Comment: NEGATIVE
Comment: NEGATIVE
Comment: NORMAL
Diagnosis: NEGATIVE
Neisseria Gonorrhea: NEGATIVE
Trichomonas: NEGATIVE

## 2021-05-26 ENCOUNTER — Other Ambulatory Visit: Payer: Self-pay | Admitting: Radiology

## 2021-06-01 ENCOUNTER — Ambulatory Visit: Payer: BC Managed Care – PPO | Admitting: Family Medicine

## 2021-06-07 ENCOUNTER — Telehealth: Payer: BC Managed Care – PPO | Admitting: Family Medicine

## 2021-07-12 ENCOUNTER — Telehealth: Payer: Self-pay | Admitting: Family Medicine

## 2021-07-12 ENCOUNTER — Ambulatory Visit (INDEPENDENT_AMBULATORY_CARE_PROVIDER_SITE_OTHER): Payer: BC Managed Care – PPO | Admitting: Family Medicine

## 2021-07-12 DIAGNOSIS — F321 Major depressive disorder, single episode, moderate: Secondary | ICD-10-CM

## 2021-07-12 DIAGNOSIS — F411 Generalized anxiety disorder: Secondary | ICD-10-CM

## 2021-07-12 MED ORDER — BUDESONIDE-FORMOTEROL FUMARATE 160-4.5 MCG/ACT IN AERO: 2.0000 | INHALATION_SPRAY | Freq: Two times a day (BID) | RESPIRATORY_TRACT | 3 refills | Status: AC

## 2021-07-12 MED ORDER — SERTRALINE HCL 100 MG PO TABS: ORAL_TABLET | ORAL | 1 refills | Status: AC

## 2021-07-12 NOTE — Telephone Encounter (Signed)
Ms. katelind, pytel are scheduled for a virtual visit with your provider today.   ? ?Just as we do with appointments in the office, we must obtain your consent to participate.  Your consent will be active for this visit and any virtual visit you may have with one of our providers in the next 365 days.   ? ?If you have a MyChart account, I can also send a copy of this consent to you electronically.  All virtual visits are billed to your insurance company just like a traditional visit in the office.  As this is a virtual visit, video technology does not allow for your provider to perform a traditional examination.  This may limit your provider's ability to fully assess your condition.  If your provider identifies any concerns that need to be evaluated in person or the need to arrange testing such as labs, EKG, etc, we will make arrangements to do so.   ? ?Although advances in technology are sophisticated, we cannot ensure that it will always work on either your end or our end.  If the connection with a video visit is poor, we may have to switch to a telephone visit.  With either a video or telephone visit, we are not always able to ensure that we have a secure connection.   I need to obtain your verbal consent now.   Are you willing to proceed with your visit today?  ? ?Kaitlyn Munoz has provided verbal consent on 07/12/2021 for a virtual visit (video or telephone). ? ? ?Marlowe Shores, LPN ?[id number]  9:12 AM ?  ?

## 2021-07-12 NOTE — Progress Notes (Addendum)
? ?  Subjective:  ? ? Patient ID: Kaitlyn Munoz, female    DOB: 06/23/1995, 26 y.o.   MRN: [id number] ?This verifies that the history review of any tests, physical exam, assessment and plan were conducted by Dr. Lilyan Punt and documented accordingly by him today ?Lilyan Punt MD primary care [facility] ? ?HPI ?Patient completing phone visit to follow up anxiety/depression and asthma.  ?She states anxiety doing much better on the medication ?Depression is improved greatly ?She now has a job with [employer] ?It is a in person job which helps her ? ?Pt states asthma has been doing a lot better. No issues with being short of breath. Has not had to use Albuterol often.  ?Symbicort has been helping ?Taking 50 mg Zoloft daily. No issues or side effects. Pt states things are going well. ? ?Virtual Visit via Telephone Note ? ?I connected with Latresha Yahr on 07/12/21 at  9:40 AM EDT by telephone and verified that I am speaking with the correct person using two identifiers. ? ?Location: ?Patient: home   ?Provider: office ?  ?I discussed the limitations, risks, security and privacy concerns of performing an evaluation and management service by telephone and the availability of in person appointments. I also discussed with the patient that there may be a patient responsible charge related to this service. The patient expressed understanding and agreed to proceed. ? ? ?History of Present Illness: ? ?  ?Observations/Objective: ? ? ?Assessment and Plan: ? ? ?Follow Up Instructions: ? ?  ?I discussed the assessment and treatment plan with the patient. The patient was provided an opportunity to ask questions and all were answered. The patient agreed with the plan and demonstrated an understanding of the instructions. ?  ?The patient was advised to call back or seek an in-person evaluation if the symptoms worsen or if the condition fails to improve as anticipated. ? ?I provided 15  minutes of  non-face-to-face time during this encounter. ? ? ?  ? ? ?Review of Systems ? ?   ?Objective:  ? Physical Exam ? ? ?Today's visit was via telephone ?Physical exam was not possible for this visit ? ? ?   ?Assessment & Plan:  ?Asthma stable ?Reduce Symbicort to 1 inhalation twice daily ?If doing well after the next few months consider tapering off ? ?Underlying anxiety and depression doing well with medication not having any problems currently refills given ?She feels her moods are doing much much better.  Not suicidal ?Patient to follow-up in approximately 4 to 6 months ?She is living in [facility] ?More than likely will get a primary care provider over that way and receive records she can call us with any concerns issues or problems currently until she switches doctors ? ?

## 2022-05-19 ENCOUNTER — Ambulatory Visit: Payer: BC Managed Care – PPO | Admitting: Radiology
